# Patient Record
Sex: Male | Born: 1975 | Race: Black or African American | Hispanic: No | Marital: Single | Smoking: Former smoker
Health system: Southern US, Community
[De-identification: ages and names within clinical notes are randomized; demographics above are authoritative.]

## PROBLEM LIST (undated history)

## (undated) DIAGNOSIS — N200 Calculus of kidney: Secondary | ICD-10-CM

## (undated) HISTORY — PX: LITHOTRIPSY: SUR834

## (undated) HISTORY — PX: KIDNEY STONE SURGERY: SHX686

---

## 2002-07-15 ENCOUNTER — Emergency Department (HOSPITAL_COMMUNITY): Admission: EM | Admit: 2002-07-15 | Discharge: 2002-07-15 | Payer: Self-pay | Admitting: *Deleted

## 2003-10-13 ENCOUNTER — Emergency Department (HOSPITAL_COMMUNITY): Admission: EM | Admit: 2003-10-13 | Discharge: 2003-10-13 | Payer: Self-pay | Admitting: Emergency Medicine

## 2007-02-07 ENCOUNTER — Emergency Department (HOSPITAL_COMMUNITY): Admission: EM | Admit: 2007-02-07 | Discharge: 2007-02-07 | Payer: Self-pay | Admitting: Emergency Medicine

## 2008-12-02 ENCOUNTER — Emergency Department (HOSPITAL_COMMUNITY): Admission: EM | Admit: 2008-12-02 | Discharge: 2008-12-02 | Payer: Self-pay | Admitting: Emergency Medicine

## 2008-12-08 ENCOUNTER — Emergency Department (HOSPITAL_COMMUNITY): Admission: EM | Admit: 2008-12-08 | Discharge: 2008-12-08 | Payer: Self-pay | Admitting: Emergency Medicine

## 2009-04-12 ENCOUNTER — Emergency Department (HOSPITAL_COMMUNITY): Admission: EM | Admit: 2009-04-12 | Discharge: 2009-04-12 | Payer: Self-pay | Admitting: Emergency Medicine

## 2009-04-24 ENCOUNTER — Emergency Department (HOSPITAL_COMMUNITY): Admission: EM | Admit: 2009-04-24 | Discharge: 2009-04-24 | Payer: Self-pay | Admitting: Emergency Medicine

## 2009-12-01 ENCOUNTER — Emergency Department (HOSPITAL_COMMUNITY): Admission: EM | Admit: 2009-12-01 | Discharge: 2009-12-01 | Payer: Self-pay | Admitting: Family Medicine

## 2009-12-03 ENCOUNTER — Emergency Department (HOSPITAL_COMMUNITY): Admission: EM | Admit: 2009-12-03 | Discharge: 2009-12-03 | Payer: Self-pay | Admitting: Emergency Medicine

## 2010-02-02 ENCOUNTER — Emergency Department (HOSPITAL_COMMUNITY): Admission: EM | Admit: 2010-02-02 | Discharge: 2010-02-02 | Payer: Self-pay | Admitting: Emergency Medicine

## 2010-02-05 ENCOUNTER — Observation Stay (HOSPITAL_COMMUNITY): Admission: EM | Admit: 2010-02-05 | Discharge: 2010-02-05 | Payer: Self-pay | Admitting: Emergency Medicine

## 2010-02-08 ENCOUNTER — Emergency Department (HOSPITAL_COMMUNITY): Admission: EM | Admit: 2010-02-08 | Discharge: 2010-02-08 | Payer: Self-pay | Admitting: Emergency Medicine

## 2011-01-18 ENCOUNTER — Emergency Department (HOSPITAL_COMMUNITY)
Admission: EM | Admit: 2011-01-18 | Discharge: 2011-01-18 | Disposition: A | Discharge status: Home / Self Care | Payer: Self-pay | Attending: Emergency Medicine | Admitting: Emergency Medicine

## 2011-01-18 DIAGNOSIS — N342 Other urethritis: Secondary | ICD-10-CM | POA: Insufficient documentation

## 2011-01-18 DIAGNOSIS — R369 Urethral discharge, unspecified: Secondary | ICD-10-CM | POA: Insufficient documentation

## 2011-01-18 LAB — DIFFERENTIAL
Basophils Absolute: 0 K/uL (ref 0.0–0.1)
Basophils Relative: 1 % (ref 0–1)
Eosinophils Absolute: 0.1 K/uL (ref 0.0–0.7)
Eosinophils Relative: 2 % (ref 0–5)
Lymphocytes Relative: 42 % (ref 12–46)
Lymphs Abs: 2.3 10*3/uL (ref 0.7–4.0)
Monocytes Absolute: 0.5 K/uL (ref 0.1–1.0)
Monocytes Relative: 10 % (ref 3–12)
Neutro Abs: 2.5 10*3/uL (ref 1.7–7.7)
Neutrophils Relative %: 46 % (ref 43–77)

## 2011-01-18 LAB — BASIC METABOLIC PANEL WITH GFR
BUN: 11 mg/dL (ref 6–23)
Calcium: 8.9 mg/dL (ref 8.4–10.5)
GFR calc non Af Amer: >60 mL/min (ref >60)
Glucose, Bld: 94 mg/dL (ref 70–99)
Potassium: 4.1 meq/L (ref 3.5–5.1)
Sodium: 140 meq/L (ref 135–145)

## 2011-01-18 LAB — BASIC METABOLIC PANEL
CO2: 25 mEq/L (ref 19–32)
Chloride: 106 mEq/L (ref 96–112)
Creatinine, Ser: 1.13 mg/dL (ref 0.4–1.5)
GFR calc Af Amer: >60 mL/min (ref >60)

## 2011-01-18 LAB — URINALYSIS, ROUTINE W REFLEX MICROSCOPIC
Bilirubin Urine: NEGATIVE
Glucose, UA: NEGATIVE mg/dL
Ketones, ur: NEGATIVE mg/dL
Nitrite: NEGATIVE
Protein, ur: NEGATIVE mg/dL
Specific Gravity, Urine: 1.017 (ref 1.005–1.030)
Urobilinogen, UA: 0.2 mg/dL (ref 0.0–1.0)
pH: 6 (ref 5.0–8.0)

## 2011-01-18 LAB — CBC
HCT: 37.1 % — ABNORMAL LOW (ref 39.0–52.0)
Hemoglobin: 13.3 g/dL (ref 13.0–17.0)
MCH: 28.8 pg (ref 26.0–34.0)
MCHC: 35.8 g/dL (ref 30.0–36.0)
MCV: 80.3 fL (ref 78.0–100.0)
Platelets: 263 K/uL (ref 150–400)
RBC: 4.62 MIL/uL (ref 4.22–5.81)
RDW: 13.6 % (ref 11.5–15.5)
WBC: 5.5 K/uL (ref 4.0–10.5)

## 2011-01-18 LAB — URINE MICROSCOPIC-ADD ON

## 2011-02-06 LAB — CBC
HCT: 38.8 % — ABNORMAL LOW (ref 39.0–52.0)
Hemoglobin: 12.4 g/dL — ABNORMAL LOW (ref 13.0–17.0)
MCV: 87.3 fL (ref 78.0–100.0)
Platelets: 239 10*3/uL (ref 150–400)
RDW: 13.8 % (ref 11.5–15.5)

## 2011-02-06 LAB — BASIC METABOLIC PANEL
BUN: 12 mg/dL (ref 6–23)
CO2: 30 mEq/L (ref 19–32)
Chloride: 107 mEq/L (ref 96–112)
Glucose, Bld: 103 mg/dL — ABNORMAL HIGH (ref 70–99)
Potassium: 3.6 mEq/L (ref 3.5–5.1)

## 2011-02-06 LAB — DIFFERENTIAL
Basophils Absolute: 0.1 10*3/uL (ref 0.0–0.1)
Eosinophils Absolute: 0.1 10*3/uL (ref 0.0–0.7)
Eosinophils Relative: 1 % (ref 0–5)
Monocytes Absolute: 0.9 10*3/uL (ref 0.1–1.0)

## 2011-02-06 LAB — CULTURE, BLOOD (ROUTINE X 2): Culture: NO GROWTH

## 2011-02-28 LAB — URINALYSIS, ROUTINE W REFLEX MICROSCOPIC
Bilirubin Urine: NEGATIVE
Hgb urine dipstick: NEGATIVE
Ketones, ur: NEGATIVE mg/dL
Nitrite: NEGATIVE
Nitrite: NEGATIVE
Protein, ur: NEGATIVE mg/dL
Specific Gravity, Urine: 1.017 (ref 1.005–1.030)
Urobilinogen, UA: 0.2 mg/dL (ref 0.0–1.0)
Urobilinogen, UA: 1 mg/dL (ref 0.0–1.0)
pH: 6.5 (ref 5.0–8.0)

## 2011-02-28 LAB — URINE MICROSCOPIC-ADD ON

## 2011-02-28 LAB — URINE CULTURE

## 2011-02-28 LAB — GC/CHLAMYDIA PROBE AMP, GENITAL
Chlamydia, DNA Probe: NEGATIVE
GC Probe Amp, Genital: NEGATIVE

## 2011-02-28 LAB — RPR: RPR Ser Ql: NONREACTIVE

## 2011-07-15 ENCOUNTER — Encounter: Payer: Self-pay | Admitting: *Deleted

## 2011-07-15 ENCOUNTER — Emergency Department (HOSPITAL_BASED_OUTPATIENT_CLINIC_OR_DEPARTMENT_OTHER)
Admission: EM | Admit: 2011-07-15 | Discharge: 2011-07-15 | Disposition: A | Discharge status: Home / Self Care | Payer: Self-pay | Attending: Emergency Medicine | Admitting: Emergency Medicine

## 2011-07-15 DIAGNOSIS — Z202 Contact with and (suspected) exposure to infections with a predominantly sexual mode of transmission: Secondary | ICD-10-CM | POA: Insufficient documentation

## 2011-07-15 HISTORY — DX: Calculus of kidney: N20.0

## 2011-07-15 LAB — URINALYSIS, ROUTINE W REFLEX MICROSCOPIC
Bilirubin Urine: NEGATIVE
Hgb urine dipstick: NEGATIVE
Ketones, ur: NEGATIVE mg/dL
Nitrite: NEGATIVE
Urobilinogen, UA: 0.2 mg/dL (ref 0.0–1.0)
pH: 7.5 (ref 5.0–8.0)

## 2011-07-15 LAB — URINE MICROSCOPIC-ADD ON

## 2011-07-15 MED ORDER — METRONIDAZOLE 500 MG PO TABS
2000.0000 mg | ORAL_TABLET | Freq: Once | ORAL | Status: AC
  Administered 2011-07-15: 2000 mg via ORAL
  Filled 2011-07-15: qty 4

## 2011-07-15 MED ORDER — ONDANSETRON 8 MG PO TBDP
8.0000 mg | ORAL_TABLET | Freq: Once | ORAL | Status: AC
  Administered 2011-07-15: 8 mg via ORAL
  Filled 2011-07-15: qty 1

## 2011-07-15 MED ORDER — LIDOCAINE HCL (PF) 1 % IJ SOLN
INTRAMUSCULAR | Status: AC
  Administered 2011-07-15: 1 mL via INTRADERMAL
  Filled 2011-07-15: qty 5

## 2011-07-15 MED ORDER — CEFTRIAXONE SODIUM 250 MG IJ SOLR
250.0000 mg | Freq: Once | INTRAMUSCULAR | Status: AC
  Administered 2011-07-15: 250 mg via INTRAMUSCULAR
  Filled 2011-07-15: qty 250

## 2011-07-15 MED ORDER — LIDOCAINE HCL (PF) 1 % IJ SOLN
1.0000 mL | Freq: Once | INTRAMUSCULAR | Status: AC
  Administered 2011-07-15: 1 mL via INTRADERMAL

## 2011-07-15 MED ORDER — AZITHROMYCIN 250 MG PO TABS
1000.0000 mg | ORAL_TABLET | Freq: Once | ORAL | Status: AC
  Administered 2011-07-15: 1000 mg via ORAL
  Filled 2011-07-15: qty 4

## 2011-07-15 NOTE — ED Provider Notes (Signed)
History     CSN: [id number] Arrival date & time: 07/15/2011 10:32 AM  Chief Complaint  Patient presents with  . Male GU Problem   Patient is a 35 y.o. male presenting with male genitourinary complaint. The history is provided by the patient.  Male GU Problem Primary symptoms include no dysuria. Primary symptoms comment: exposure to STD This is a new problem. The current episode started more than 2 days ago. The problem has not changed since onset.Pertinent negatives include no nausea, no vomiting, no abdominal pain and no frequency. There has been no fever. He has tried nothing for the symptoms. Sexual activity: sexually active (last partner was recently diagnosed with either trichomonsas or chlamydia. ). Partner displays symptoms of an STD: yes.    Past Medical History  Diagnosis Date  . Kidney stone     Past Surgical History  Procedure Date  . Kidney stone surgery     No family history on file.  History  Substance Use Topics  . Smoking status: Never Smoker   . Smokeless tobacco: Not on file  . Alcohol Use: Yes      Review of Systems  Constitutional: Negative for fever.  Gastrointestinal: Negative for nausea, vomiting and abdominal pain.  Genitourinary: Negative for dysuria, urgency, frequency, discharge, penile swelling and testicular pain.  Musculoskeletal: Negative for back pain.    Physical Exam  BP 128/69  Pulse 56  Temp(Src) 97.9 F (36.6 C) (Oral)  Resp 18  Ht 5\' 9"  (1.753 m)  Wt 170 lb (77.111 kg)  BMI 25.10 kg/m2  SpO2 100%  Physical Exam  Constitutional: He is oriented to person, place, and time. He appears well-developed and well-nourished.  HENT:  Head: Normocephalic and atraumatic.  Eyes: Pupils are equal, round, and reactive to light.  Neck: Normal range of motion. Neck supple.  Cardiovascular: Normal rate, regular rhythm and normal heart sounds.   Pulmonary/Chest: Effort normal and breath sounds normal. No respiratory distress. He has no  wheezes. He has no rales. He exhibits no tenderness.  Abdominal: Soft. Bowel sounds are normal. There is no tenderness. There is no rebound and no guarding.  Genitourinary: Penis normal. No penile tenderness.  Musculoskeletal: Normal range of motion. He exhibits no edema.  Lymphadenopathy:    He has no cervical adenopathy.  Neurological: He is alert and oriented to person, place, and time.  Skin: Skin is warm and dry. No rash noted.  Psychiatric: He has a normal mood and affect.    ED Course  Procedures  MDM Gc/chlamydia ordered.  Will go ahead and tx pt  Results for orders placed during the hospital encounter of 07/15/11  URINALYSIS, ROUTINE W REFLEX MICROSCOPIC      Component Value Range   Color, Urine YELLOW  YELLOW    Appearance CLEAR  CLEAR    Specific Gravity, Urine 1.014  1.005 - 1.030    pH 7.5  5.0 - 8.0    Glucose, UA NEGATIVE  NEGATIVE (mg/dL)   Hgb urine dipstick NEGATIVE  NEGATIVE    Bilirubin Urine NEGATIVE  NEGATIVE    Ketones, ur NEGATIVE  NEGATIVE (mg/dL)   Protein, ur NEGATIVE  NEGATIVE (mg/dL)   Urobilinogen, UA 0.2  0.0 - 1.0 (mg/dL)   Nitrite NEGATIVE  NEGATIVE    Leukocytes, UA TRACE (*) NEGATIVE   URINE MICROSCOPIC-ADD ON      Component Value Range   Squamous Epithelial / LPF RARE  RARE    WBC, UA 3-6  <3 (WBC/hpf)  RBC / HPF 0-2  <3 (RBC/hpf)   Bacteria, UA FEW (*) RARE    No results found.        Rolan Bucco, MD 07/15/11 1239

## 2011-07-15 NOTE — ED Notes (Addendum)
Pt sts his partner was recently dx with trichomonas. Pt denies discharge or difficulty urinating.

## 2011-07-16 LAB — GC/CHLAMYDIA PROBE AMP, GENITAL
Chlamydia, DNA Probe: NEGATIVE
GC Probe Amp, Genital: NEGATIVE

## 2011-09-28 ENCOUNTER — Encounter (HOSPITAL_BASED_OUTPATIENT_CLINIC_OR_DEPARTMENT_OTHER): Payer: Self-pay

## 2011-09-28 ENCOUNTER — Emergency Department (INDEPENDENT_AMBULATORY_CARE_PROVIDER_SITE_OTHER): Payer: Self-pay

## 2011-09-28 ENCOUNTER — Emergency Department (HOSPITAL_BASED_OUTPATIENT_CLINIC_OR_DEPARTMENT_OTHER)
Admission: EM | Admit: 2011-09-28 | Discharge: 2011-09-28 | Disposition: A | Discharge status: Home / Self Care | Payer: Self-pay | Attending: Emergency Medicine | Admitting: Emergency Medicine

## 2011-09-28 DIAGNOSIS — IMO0001 Reserved for inherently not codable concepts without codable children: Secondary | ICD-10-CM | POA: Insufficient documentation

## 2011-09-28 DIAGNOSIS — R509 Fever, unspecified: Secondary | ICD-10-CM

## 2011-09-28 DIAGNOSIS — R079 Chest pain, unspecified: Secondary | ICD-10-CM

## 2011-09-28 DIAGNOSIS — J069 Acute upper respiratory infection, unspecified: Secondary | ICD-10-CM | POA: Insufficient documentation

## 2011-09-28 DIAGNOSIS — R05 Cough: Secondary | ICD-10-CM | POA: Insufficient documentation

## 2011-09-28 DIAGNOSIS — R0602 Shortness of breath: Secondary | ICD-10-CM

## 2011-09-28 DIAGNOSIS — R059 Cough, unspecified: Secondary | ICD-10-CM | POA: Insufficient documentation

## 2011-09-28 MED ORDER — ONDANSETRON HCL 8 MG PO TABS
8.0000 mg | ORAL_TABLET | Freq: Once | ORAL | Status: AC
  Administered 2011-09-28: 8 mg via ORAL

## 2011-09-28 MED ORDER — ONDANSETRON HCL 8 MG PO TABS
ORAL_TABLET | ORAL | Status: AC
  Filled 2011-09-28: qty 1

## 2011-09-28 MED ORDER — IBUPROFEN 800 MG PO TABS: 800.0000 mg | ORAL_TABLET | Freq: Three times a day (TID) | ORAL | Status: AC

## 2011-09-28 MED ORDER — ACETAMINOPHEN 325 MG PO TABS
650.0000 mg | ORAL_TABLET | Freq: Once | ORAL | Status: AC
  Administered 2011-09-28: 650 mg via ORAL
  Filled 2011-09-28: qty 2

## 2011-09-28 MED ORDER — IBUPROFEN 800 MG PO TABS
800.0000 mg | ORAL_TABLET | Freq: Once | ORAL | Status: AC
  Administered 2011-09-28: 800 mg via ORAL
  Filled 2011-09-28: qty 1

## 2011-09-28 MED ORDER — ACETAMINOPHEN-CODEINE 120-12 MG/5ML PO SUSP: 5.0000 mL | Freq: Four times a day (QID) | ORAL | Status: AC | PRN

## 2011-09-28 NOTE — ED Provider Notes (Signed)
History     CSN: [id number] Arrival date & time: 09/28/2011  8:23 PM   First MD Initiated Contact with Patient 09/28/11 2137      Chief Complaint  Patient presents with  . URI    (Consider location/radiation/quality/duration/timing/severity/associated sxs/prior treatment) Patient is a 35 y.o. male presenting with URI. The history is provided by the patient.  URI The primary symptoms include fever, sore throat, cough and myalgias. Primary symptoms do not include headaches, wheezing, abdominal pain, vomiting or rash. The current episode started yesterday. This is a new problem. The problem has not changed since onset. The onset of the illness is associated with exposure to sick contacts. Symptoms associated with the illness include chills and rhinorrhea. The illness is not associated with sinus pressure. Risk factors: No diabetes or medications. He is a previous smoker.   Moderate in severity. No aggravating or alleviating factors. Taking over-the-counter medications with minimal relief. Patient became worried when he started sweating and felt febrile tonight. Wife and child at home had similar symptoms.  Past Medical History  Diagnosis Date  . Kidney stone     Past Surgical History  Procedure Date  . Kidney stone surgery     No family history on file.  History  Substance Use Topics  . Smoking status: Never Smoker   . Smokeless tobacco: Not on file  . Alcohol Use: No      Review of Systems  Constitutional: Positive for fever and chills.  HENT: Positive for sore throat and rhinorrhea. Negative for neck pain, neck stiffness and sinus pressure.   Eyes: Negative for pain.  Respiratory: Positive for cough. Negative for shortness of breath and wheezing.   Cardiovascular: Negative for chest pain.  Gastrointestinal: Negative for vomiting and abdominal pain.  Genitourinary: Negative for dysuria.  Musculoskeletal: Positive for myalgias. Negative for back pain.  Skin: Negative  for rash.  Neurological: Negative for headaches.  All other systems reviewed and are negative.    Allergies  Review of patient's allergies indicates no known allergies.  Home Medications   Current Outpatient Rx  Name Route Sig Dispense Refill  . PSEUDOEPHEDRINE-APAP-DM 95-621-30 MG/30ML PO LIQD Oral Take 30 mLs by mouth once.        BP 127/70  Pulse 80  Temp(Src) 99.8 F (37.7 C) (Oral)  Resp 20  Ht 5\' 9"  (1.753 m)  Wt 165 lb (74.844 kg)  BMI 24.37 kg/m2  SpO2 99%  Physical Exam  Constitutional: He is oriented to person, place, and time. He appears well-developed and well-nourished.  HENT:  Head: Normocephalic and atraumatic.  Eyes: Conjunctivae and EOM are normal. Pupils are equal, round, and reactive to light.  Neck: Trachea normal. Neck supple. No thyromegaly present.  Cardiovascular: Normal rate, regular rhythm, S1 normal, S2 normal and normal pulses.     No systolic murmur is present   No diastolic murmur is present  Pulses:      Radial pulses are 2+ on the right side, and 2+ on the left side.  Pulmonary/Chest: Effort normal and breath sounds normal. He has no wheezes. He has no rhonchi. He has no rales. He exhibits no tenderness.  Abdominal: Soft. Normal appearance and bowel sounds are normal. There is no tenderness. There is no CVA tenderness and negative Murphy's sign.  Musculoskeletal:       BLE:s Calves nontender, no cords or erythema, negative Homans sign  Neurological: He is alert and oriented to person, place, and time. He has normal strength. No cranial  nerve deficit or sensory deficit. GCS eye subscore is 4. GCS verbal subscore is 5. GCS motor subscore is 6.  Skin: Skin is warm and dry. No rash noted. He is not diaphoretic.  Psychiatric: His speech is normal.       Cooperative and appropriate    ED Course  Procedures (including critical care time)  Labs Reviewed - No data to display Dg Chest 2 View  09/28/2011  *RADIOLOGY REPORT*  Clinical Data:  35 year old male with cough and fever.  Shortness of breath and chest pain.  CHEST - 2 VIEW  Comparison: 04/12/2009.  Findings: Lung volumes remain normal. Normal cardiac size and mediastinal contours.  Visualized tracheal air column is within normal limits.  The lungs remain clear.  No pneumothorax or effusion. No osseous abnormality identified.  IMPRESSION: Negative, no acute cardiopulmonary abnormality.  Original Report Authenticated By: Harley Hallmark, M.D.    Tylenol, Motrin, by mouth fluids and x-rays above.   MDM   Clinical presentation consistent with URI. Likely viral with sick contacts at home. Tylenol and Motrin provided emergency department and chest x-ray obtained and reviewed as above. No pneumonia. Stable for discharge home with viral URI precautions.        Sunnie Nielsen, MD 09/28/11 [phone]

## 2011-09-28 NOTE — ED Notes (Signed)
C/o sweats, chills, cough

## 2012-02-29 ENCOUNTER — Encounter (HOSPITAL_BASED_OUTPATIENT_CLINIC_OR_DEPARTMENT_OTHER): Payer: Self-pay | Admitting: *Deleted

## 2012-02-29 DIAGNOSIS — R109 Unspecified abdominal pain: Secondary | ICD-10-CM | POA: Insufficient documentation

## 2012-02-29 LAB — URINALYSIS, ROUTINE W REFLEX MICROSCOPIC
Ketones, ur: NEGATIVE mg/dL
Nitrite: NEGATIVE
Urobilinogen, UA: 0.2 mg/dL (ref 0.0–1.0)
pH: 6 (ref 5.0–8.0)

## 2012-02-29 LAB — URINE MICROSCOPIC-ADD ON

## 2012-02-29 NOTE — ED Notes (Signed)
Pt c/o left flank pain x 2 days.

## 2012-02-29 NOTE — ED Notes (Signed)
Hx kidney stones

## 2012-03-01 ENCOUNTER — Emergency Department (HOSPITAL_BASED_OUTPATIENT_CLINIC_OR_DEPARTMENT_OTHER)
Admission: EM | Admit: 2012-03-01 | Discharge: 2012-03-01 | Disposition: A | Discharge status: Home / Self Care | Payer: Self-pay | Attending: Emergency Medicine | Admitting: Emergency Medicine

## 2012-03-01 HISTORY — DX: Calculus of kidney: N20.0

## 2012-03-01 NOTE — ED Notes (Signed)
Pt not found in waiting room.

## 2012-03-01 NOTE — ED Notes (Signed)
Pt called in lobby and parking lot no 'answeer

## 2012-07-06 ENCOUNTER — Emergency Department (HOSPITAL_BASED_OUTPATIENT_CLINIC_OR_DEPARTMENT_OTHER)
Admission: EM | Admit: 2012-07-06 | Discharge: 2012-07-06 | Disposition: A | Discharge status: Home / Self Care | Payer: Self-pay | Attending: Emergency Medicine | Admitting: Emergency Medicine

## 2012-07-06 ENCOUNTER — Encounter (HOSPITAL_BASED_OUTPATIENT_CLINIC_OR_DEPARTMENT_OTHER): Payer: Self-pay | Admitting: Emergency Medicine

## 2012-07-06 DIAGNOSIS — A5900 Urogenital trichomoniasis, unspecified: Secondary | ICD-10-CM | POA: Insufficient documentation

## 2012-07-06 DIAGNOSIS — Z202 Contact with and (suspected) exposure to infections with a predominantly sexual mode of transmission: Secondary | ICD-10-CM | POA: Insufficient documentation

## 2012-07-06 DIAGNOSIS — Z87891 Personal history of nicotine dependence: Secondary | ICD-10-CM | POA: Insufficient documentation

## 2012-07-06 DIAGNOSIS — Z87442 Personal history of urinary calculi: Secondary | ICD-10-CM | POA: Insufficient documentation

## 2012-07-06 LAB — URINE MICROSCOPIC-ADD ON

## 2012-07-06 LAB — URINALYSIS, ROUTINE W REFLEX MICROSCOPIC
Bilirubin Urine: NEGATIVE
Nitrite: NEGATIVE
Specific Gravity, Urine: 1.015 (ref 1.005–1.030)
Urobilinogen, UA: 1 mg/dL (ref 0.0–1.0)
pH: 7 (ref 5.0–8.0)

## 2012-07-06 MED ORDER — AZITHROMYCIN 250 MG PO TABS
1000.0000 mg | ORAL_TABLET | Freq: Once | ORAL | Status: AC
  Administered 2012-07-06: 1000 mg via ORAL
  Filled 2012-07-06: qty 4

## 2012-07-06 MED ORDER — METRONIDAZOLE 500 MG PO TABS
2000.0000 mg | ORAL_TABLET | Freq: Once | ORAL | Status: AC
  Administered 2012-07-06: 2000 mg via ORAL
  Filled 2012-07-06: qty 4

## 2012-07-06 MED ORDER — LIDOCAINE HCL (PF) 1 % IJ SOLN
INTRAMUSCULAR | Status: AC
  Administered 2012-07-06: 5 mL
  Filled 2012-07-06: qty 5

## 2012-07-06 MED ORDER — CEFTRIAXONE SODIUM 250 MG IJ SOLR
250.0000 mg | Freq: Once | INTRAMUSCULAR | Status: AC
  Administered 2012-07-06: 250 mg via INTRAMUSCULAR
  Filled 2012-07-06: qty 250

## 2012-07-06 NOTE — ED Provider Notes (Signed)
History     CSN: [id number]  Arrival date & time 07/06/12  1049   First MD Initiated Contact with Patient 07/06/12 1057      Chief Complaint  Patient presents with  . SEXUALLY TRANSMITTED DISEASE    (Consider location/radiation/quality/duration/timing/severity/associated sxs/prior treatment) HPI Pt reports his sexual partner was recently diagnosed with trichomonas. He has had unprotected sex with her. He denies any symptoms himself. No penile discharge, pain with urination, testicular pain or swelling.   Past Medical History  Diagnosis Date  . Kidney stone   . Kidney calculi     Past Surgical History  Procedure Date  . Kidney stone surgery   . Lithotripsy     No family history on file.  History  Substance Use Topics  . Smoking status: Former Games developer  . Smokeless tobacco: Not on file  . Alcohol Use: Yes     occ      Review of Systems All other systems reviewed and are negative except as noted in HPI.    Allergies  Review of patient's allergies indicates no known allergies.  Home Medications   Current Outpatient Rx  Name Route Sig Dispense Refill  . PSEUDOEPHEDRINE-APAP-DM 56-213-08 MG/30ML PO LIQD Oral Take 30 mLs by mouth once.        BP 131/62  Pulse 63  Temp 98.1 F (36.7 C) (Oral)  Resp 20  Ht 5\' 9"  (1.753 m)  Wt 155 lb (70.308 kg)  BMI 22.89 kg/m2  SpO2 100%  Physical Exam  Constitutional: He is oriented to person, place, and time. He appears well-developed and well-nourished.  HENT:  Head: Normocephalic and atraumatic.  Neck: Neck supple.  Pulmonary/Chest: Effort normal.  Genitourinary: Penis normal.       No discharge or sores, no lymphadenopathy, no testicular pain or swelling  Neurological: He is alert and oriented to person, place, and time. No cranial nerve deficit.  Psychiatric: He has a normal mood and affect. His behavior is normal.    ED Course  Procedures (including critical care time)   Labs Reviewed  GC/CHLAMYDIA PROBE  AMP, URINE  URINALYSIS, ROUTINE W REFLEX MICROSCOPIC   No results found.   No diagnosis found.    MDM  UA and GC/C sent. Will treat empirically for trich as well as GC/C       Charles B. Bernette Mayers, MD 07/06/12 1120

## 2012-07-06 NOTE — ED Notes (Signed)
Pt reports his partner being dx with STD.

## 2012-07-07 LAB — GC/CHLAMYDIA PROBE AMP, URINE
Chlamydia, Swab/Urine, PCR: NEGATIVE
GC Probe Amp, Urine: NEGATIVE

## 2012-12-26 ENCOUNTER — Encounter (HOSPITAL_BASED_OUTPATIENT_CLINIC_OR_DEPARTMENT_OTHER): Payer: Self-pay

## 2012-12-26 ENCOUNTER — Emergency Department (HOSPITAL_BASED_OUTPATIENT_CLINIC_OR_DEPARTMENT_OTHER)
Admission: EM | Admit: 2012-12-26 | Discharge: 2012-12-26 | Disposition: A | Discharge status: Home / Self Care | Payer: Self-pay | Attending: Emergency Medicine | Admitting: Emergency Medicine

## 2012-12-26 DIAGNOSIS — L0231 Cutaneous abscess of buttock: Secondary | ICD-10-CM | POA: Insufficient documentation

## 2012-12-26 DIAGNOSIS — L03317 Cellulitis of buttock: Secondary | ICD-10-CM | POA: Insufficient documentation

## 2012-12-26 DIAGNOSIS — Z87442 Personal history of urinary calculi: Secondary | ICD-10-CM | POA: Insufficient documentation

## 2012-12-26 DIAGNOSIS — F17200 Nicotine dependence, unspecified, uncomplicated: Secondary | ICD-10-CM | POA: Insufficient documentation

## 2012-12-26 MED ORDER — SULFAMETHOXAZOLE-TRIMETHOPRIM 800-160 MG PO TABS: 1.0000 | ORAL_TABLET | Freq: Two times a day (BID) | ORAL | Status: DC

## 2012-12-26 MED ORDER — HYDROCODONE-ACETAMINOPHEN 5-325 MG PO TABS: 2.0000 | ORAL_TABLET | Freq: Four times a day (QID) | ORAL | Status: DC | PRN

## 2012-12-26 NOTE — ED Notes (Signed)
Abscess to right buttock x 1-2 weeks

## 2012-12-26 NOTE — ED Provider Notes (Signed)
History     CSN: [id number]  Arrival date & time 12/26/12  1156   First MD Initiated Contact with Patient 12/26/12 1212      Chief Complaint  Patient presents with  . Abscess    (Consider location/radiation/quality/duration/timing/severity/associated sxs/prior treatment) Patient is a 37 y.o. male presenting with abscess. The history is provided by the patient.  Abscess Abscess location: buttock. Abscess quality: fluctuance, induration, painful, redness and warmth   Red streaking: no   Duration:  7 days Progression:  Worsening Pain details:    Quality:  Throbbing   Severity:  Moderate   Timing:  Constant   Progression:  Worsening Chronicity:  New Relieved by:  Nothing Worsened by:  Nothing tried Ineffective treatments:  None tried   Past Medical History  Diagnosis Date  . Kidney stone   . Kidney calculi     Past Surgical History  Procedure Laterality Date  . Kidney stone surgery    . Lithotripsy      No family history on file.  History  Substance Use Topics  . Smoking status: Current Every Day Smoker  . Smokeless tobacco: Not on file  . Alcohol Use: Yes     Comment: occ      Review of Systems  All other systems reviewed and are negative.    Allergies  Review of patient's allergies indicates no known allergies.  Home Medications   Current Outpatient Rx  Name  Route  Sig  Dispense  Refill  . Pseudoephedrine-APAP-DM (DAYQUIL MULTI-SYMPTOM) 60-650-20 MG/30ML LIQD   Oral   Take 30 mLs by mouth once.             BP 147/72  Pulse 61  Temp(Src) 97.8 F (36.6 C) (Oral)  Resp 16  Ht 5\' 9"  (1.753 m)  Wt 170 lb (77.111 kg)  BMI 25.09 kg/m2  SpO2 100%  Physical Exam  Nursing note and vitals reviewed. Constitutional: He is oriented to person, place, and time. He appears well-developed and well-nourished. No distress.  HENT:  Head: Normocephalic and atraumatic.  Mouth/Throat: Oropharynx is clear and moist.  Neck: Normal range of motion.  Neck supple.  Neurological: He is alert and oriented to person, place, and time.  Skin: Skin is warm and dry. He is not diaphoretic.  There is a swollen, ttp, fluctuant area to the right buttock.  There is mild erythema.    ED Course  Procedures (including critical care time)  Labs Reviewed - No data to display No results found.   No diagnosis found.  INCISION AND DRAINAGE Performed by: Geoffery Lyons Consent: Verbal consent obtained. Risks and benefits: risks, benefits and alternatives were discussed Type: abscess  Body area: right buttock  Anesthesia: local infiltration  Incision was made with a scalpel.  Local anesthetic: lidocaine 1% without epinephrine  Anesthetic total: 2 ml  Complexity: complex Blunt dissection to break up loculations  Drainage: purulent  Drainage amount: mild  Packing material: none  Patient tolerance: Patient tolerated the procedure well with no immediate complications.     MDM  The abscess was I and Ded and will be treated with warm soaks, local wound care.  I will prescribe bactrim and pain meds.  Return prn.          Geoffery Lyons, MD 12/26/12 1229

## 2013-02-14 ENCOUNTER — Encounter (HOSPITAL_BASED_OUTPATIENT_CLINIC_OR_DEPARTMENT_OTHER): Payer: Self-pay

## 2013-02-14 ENCOUNTER — Emergency Department (HOSPITAL_BASED_OUTPATIENT_CLINIC_OR_DEPARTMENT_OTHER)
Admission: EM | Admit: 2013-02-14 | Discharge: 2013-02-14 | Disposition: A | Discharge status: Home / Self Care | Payer: Self-pay | Attending: Emergency Medicine | Admitting: Emergency Medicine

## 2013-02-14 DIAGNOSIS — Z87891 Personal history of nicotine dependence: Secondary | ICD-10-CM | POA: Insufficient documentation

## 2013-02-14 DIAGNOSIS — L539 Erythematous condition, unspecified: Secondary | ICD-10-CM | POA: Insufficient documentation

## 2013-02-14 DIAGNOSIS — L02415 Cutaneous abscess of right lower limb: Secondary | ICD-10-CM

## 2013-02-14 DIAGNOSIS — Z87442 Personal history of urinary calculi: Secondary | ICD-10-CM | POA: Insufficient documentation

## 2013-02-14 DIAGNOSIS — L02419 Cutaneous abscess of limb, unspecified: Secondary | ICD-10-CM | POA: Insufficient documentation

## 2013-02-14 MED ORDER — HYDROCODONE-ACETAMINOPHEN 5-325 MG PO TABS
ORAL_TABLET | ORAL | Status: AC
  Filled 2013-02-14: qty 1

## 2013-02-14 MED ORDER — CEPHALEXIN 500 MG PO CAPS: 500.0000 mg | ORAL_CAPSULE | Freq: Four times a day (QID) | ORAL | Status: DC

## 2013-02-14 MED ORDER — HYDROCODONE-ACETAMINOPHEN 5-325 MG PO TABS
2.0000 | ORAL_TABLET | Freq: Once | ORAL | Status: AC
  Administered 2013-02-14: 2 via ORAL
  Filled 2013-02-14: qty 1

## 2013-02-14 MED ORDER — HYDROCODONE-ACETAMINOPHEN 5-325 MG PO TABS: 2.0000 | ORAL_TABLET | ORAL | Status: DC | PRN

## 2013-02-14 MED ORDER — SULFAMETHOXAZOLE-TRIMETHOPRIM 800-160 MG PO TABS: 1.0000 | ORAL_TABLET | Freq: Two times a day (BID) | ORAL | Status: DC

## 2013-02-14 MED ORDER — LIDOCAINE HCL 2 % IJ SOLN
INTRAMUSCULAR | Status: AC
  Administered 2013-02-14: 400 mg
  Filled 2013-02-14: qty 20

## 2013-02-14 MED ORDER — CEFTRIAXONE SODIUM 1 G IJ SOLR
1.0000 g | Freq: Once | INTRAMUSCULAR | Status: AC
  Administered 2013-02-14: 1 g via INTRAMUSCULAR
  Filled 2013-02-14: qty 10

## 2013-02-14 NOTE — ED Notes (Signed)
I&D riht knee performed by Keenan Bachelor, PA-C- pt tolerated well.

## 2013-02-14 NOTE — ED Notes (Signed)
Redness/pain to right knee x 2-3 days-denies injury

## 2013-02-16 ENCOUNTER — Emergency Department (HOSPITAL_BASED_OUTPATIENT_CLINIC_OR_DEPARTMENT_OTHER)
Admission: EM | Admit: 2013-02-16 | Discharge: 2013-02-16 | Disposition: A | Discharge status: Home / Self Care | Payer: Self-pay | Attending: Emergency Medicine | Admitting: Emergency Medicine

## 2013-02-16 ENCOUNTER — Encounter (HOSPITAL_BASED_OUTPATIENT_CLINIC_OR_DEPARTMENT_OTHER): Payer: Self-pay

## 2013-02-16 DIAGNOSIS — Z87442 Personal history of urinary calculi: Secondary | ICD-10-CM | POA: Insufficient documentation

## 2013-02-16 DIAGNOSIS — L03119 Cellulitis of unspecified part of limb: Secondary | ICD-10-CM | POA: Insufficient documentation

## 2013-02-16 DIAGNOSIS — Z87891 Personal history of nicotine dependence: Secondary | ICD-10-CM | POA: Insufficient documentation

## 2013-02-16 DIAGNOSIS — L0291 Cutaneous abscess, unspecified: Secondary | ICD-10-CM

## 2013-02-16 DIAGNOSIS — L02419 Cutaneous abscess of limb, unspecified: Secondary | ICD-10-CM | POA: Insufficient documentation

## 2013-02-16 NOTE — ED Notes (Signed)
Pt states that he was seen here last week for R knee inflammation and swelling.  Pt states that he presents today for MD to reevaluate the swelling and see if it has improved or not.

## 2013-02-16 NOTE — ED Provider Notes (Signed)
History     CSN: [id number]  Arrival date & time 02/16/13  1204   First MD Initiated Contact with Patient 02/16/13 1214      Chief Complaint  Patient presents with  . Follow-up    (Consider location/radiation/quality/duration/timing/severity/associated sxs/prior treatment) HPI Comments: Patient is here for recheck of an abscess on his right knee. He was seen here 2 days ago and had an ID of an abscess to his right knee. He was started on Bactrim and Keflex. He states that he's been taking the antibiotics and doing warm soaks. He states that his knee is feeling much better. He states that the swelling and redness have markedly decreased and that the pain is better. He does say that still draining. He denies any fevers or chills.   Past Medical History  Diagnosis Date  . Kidney stone   . Kidney calculi     Past Surgical History  Procedure Laterality Date  . Kidney stone surgery    . Lithotripsy      History reviewed. No pertinent family history.  History  Substance Use Topics  . Smoking status: Former Games developer  . Smokeless tobacco: Not on file  . Alcohol Use: No      Review of Systems  Constitutional: Negative for fever.  Gastrointestinal: Negative for nausea and vomiting.  Musculoskeletal: Positive for arthralgias.  Skin: Positive for wound.  Neurological: Negative for headaches.    Allergies  Review of patient's allergies indicates no known allergies.  Home Medications   Current Outpatient Rx  Name  Route  Sig  Dispense  Refill  . cephALEXin (KEFLEX) 500 MG capsule   Oral   Take 1 capsule (500 mg total) by mouth 4 (four) times daily.   28 capsule   0   . HYDROcodone-acetaminophen (NORCO/VICODIN) 5-325 MG per tablet   Oral   Take 2 tablets by mouth every 6 (six) hours as needed for pain.   15 tablet   0   . Pseudoephedrine-APAP-DM (DAYQUIL MULTI-SYMPTOM) 60-650-20 MG/30ML LIQD   Oral   Take 30 mLs by mouth once.           .  sulfamethoxazole-trimethoprim (BACTRIM DS,SEPTRA DS) 800-160 MG per tablet   Oral   Take 1 tablet by mouth 2 (two) times daily.   14 tablet   0   . HYDROcodone-acetaminophen (NORCO/VICODIN) 5-325 MG per tablet   Oral   Take 2 tablets by mouth every 4 (four) hours as needed for pain.   10 tablet   0   . sulfamethoxazole-trimethoprim (SEPTRA DS) 800-160 MG per tablet   Oral   Take 1 tablet by mouth every 12 (twelve) hours.   14 tablet   0     BP 123/71  Pulse 69  Temp(Src) 98.6 F (37 C) (Oral)  Resp 16  Ht 5\' 9"  (1.753 m)  Wt 160 lb (72.576 kg)  BMI 23.62 kg/m2  SpO2 100%  Physical Exam  Constitutional: He appears well-developed and well-nourished.  Cardiovascular: Normal rate.   Pulmonary/Chest: Effort normal.  Musculoskeletal:  Patient has a small draining wound to his right suprapatellar area. It is draining but there is no surrounding induration or fluctuance. It is still swollen in the area and there's some mild surrounding erythema. There is no joint effusion or tenderness on range of motion of the joint.    ED Course  Procedures (including critical care time)  Labs Reviewed - No data to display No results found.   1. Abscess  MDM  Patient with improving infection of his right knee. There is no evidence of joint involvement. He states it's markedly improved since 2 days ago. I advised him to continue antibiotics through a full course and to return here as needed if his symptoms worsen.        Rolan Bucco, MD 02/16/13 [phone]

## 2013-02-17 NOTE — ED Provider Notes (Signed)
History     CSN: [id number]  Arrival date & time 02/14/13  1747   First MD Initiated Contact with Patient 02/14/13 1816      Chief Complaint  Patient presents with  . Knee Pain    (Consider location/radiation/quality/duration/timing/severity/associated sxs/prior treatment) Patient is a 37 y.o. male presenting with rash. The history is provided by the patient. No language interpreter was used.  Rash Location:  Leg Leg rash location:  R knee Quality: painful and redness   Pain details:    Quality:  Burning   Severity:  Moderate   Duration:  3 days   Timing:  Constant   Progression:  Worsening Severity:  Moderate Duration:  3 days Progression:  Improving Relieved by:  Nothing Worsened by:  Nothing tried Associated symptoms: no fever and no nausea    Pt complains of redness and swelling to his right knee Past Medical History  Diagnosis Date  . Kidney stone   . Kidney calculi     Past Surgical History  Procedure Laterality Date  . Kidney stone surgery    . Lithotripsy      No family history on file.  History  Substance Use Topics  . Smoking status: Former Games developer  . Smokeless tobacco: Not on file  . Alcohol Use: No      Review of Systems  Constitutional: Negative for fever.  Gastrointestinal: Negative for nausea.  Musculoskeletal: Positive for joint swelling.  Skin: Positive for rash.  All other systems reviewed and are negative.    Allergies  Review of patient's allergies indicates no known allergies.  Home Medications   Current Outpatient Rx  Name  Route  Sig  Dispense  Refill  . cephALEXin (KEFLEX) 500 MG capsule   Oral   Take 1 capsule (500 mg total) by mouth 4 (four) times daily.   28 capsule   0   . HYDROcodone-acetaminophen (NORCO/VICODIN) 5-325 MG per tablet   Oral   Take 2 tablets by mouth every 6 (six) hours as needed for pain.   15 tablet   0   . HYDROcodone-acetaminophen (NORCO/VICODIN) 5-325 MG per tablet   Oral   Take 2  tablets by mouth every 4 (four) hours as needed for pain.   10 tablet   0   . Pseudoephedrine-APAP-DM (DAYQUIL MULTI-SYMPTOM) 60-650-20 MG/30ML LIQD   Oral   Take 30 mLs by mouth once.           . sulfamethoxazole-trimethoprim (BACTRIM DS,SEPTRA DS) 800-160 MG per tablet   Oral   Take 1 tablet by mouth 2 (two) times daily.   14 tablet   0   . sulfamethoxazole-trimethoprim (SEPTRA DS) 800-160 MG per tablet   Oral   Take 1 tablet by mouth every 12 (twelve) hours.   14 tablet   0     BP 123/64  Pulse 67  Temp(Src) 98.6 F (37 C) (Oral)  Resp 16  Ht 5\' 9"  (1.753 m)  Wt 150 lb (68.04 kg)  BMI 22.14 kg/m2  SpO2 100%  Physical Exam  Nursing note and vitals reviewed. Constitutional: He is oriented to person, place, and time. He appears well-developed and well-nourished.  HENT:  Head: Normocephalic and atraumatic.  Cardiovascular: Normal rate.   Pulmonary/Chest: Effort normal.  Neurological: He is alert and oriented to person, place, and time. He has normal reflexes.  Skin: There is erythema.  12 cm area of erythema warm to touch  Psychiatric: He has a normal mood and affect.  ED Course  INCISION AND DRAINAGE Date/Time: 02/18/2013 11:15 PM Performed by: Elson Areas Authorized by: Elson Areas Consent: Verbal consent obtained. Risks and benefits: risks, benefits and alternatives were discussed Consent given by: patient Patient understanding: patient states understanding of the procedure being performed Patient identity confirmed: verbally with patient Time out: Immediately prior to procedure a "time out" was called to verify the correct patient, procedure, equipment, support staff and site/side marked as required. Type: abscess Body area: lower extremity Location details: right leg Local anesthetic: lidocaine 1% without epinephrine Scalpel size: 11 Incision type: single straight Complexity: simple Drainage amount: moderate Wound treatment: wound left  open Patient tolerance: Patient tolerated the procedure well with no immediate complications.   (including critical care time)  Labs Reviewed - No data to display No results found.   1. Abscess of knee, right       MDM  rx for bactrim and keflex,   Pt advised to return in 2 days for wound recheck        Elson Areas, PA-C 02/18/13 2316

## 2013-02-21 NOTE — ED Provider Notes (Signed)
Medical screening examination/treatment/procedure(s) were performed by non-physician practitioner and as supervising physician I was immediately available for consultation/collaboration.   Karely Hurtado, MD 02/21/13 0704 

## 2013-04-02 ENCOUNTER — Encounter (HOSPITAL_BASED_OUTPATIENT_CLINIC_OR_DEPARTMENT_OTHER): Payer: Self-pay | Admitting: *Deleted

## 2013-04-02 ENCOUNTER — Emergency Department (HOSPITAL_BASED_OUTPATIENT_CLINIC_OR_DEPARTMENT_OTHER)
Admission: EM | Admit: 2013-04-02 | Discharge: 2013-04-02 | Disposition: A | Discharge status: Home / Self Care | Payer: Self-pay | Attending: Emergency Medicine | Admitting: Emergency Medicine

## 2013-04-02 DIAGNOSIS — Z87442 Personal history of urinary calculi: Secondary | ICD-10-CM | POA: Insufficient documentation

## 2013-04-02 DIAGNOSIS — Z79899 Other long term (current) drug therapy: Secondary | ICD-10-CM | POA: Insufficient documentation

## 2013-04-02 DIAGNOSIS — Z87891 Personal history of nicotine dependence: Secondary | ICD-10-CM | POA: Insufficient documentation

## 2013-04-02 DIAGNOSIS — M7989 Other specified soft tissue disorders: Secondary | ICD-10-CM | POA: Insufficient documentation

## 2013-04-02 DIAGNOSIS — L0390 Cellulitis, unspecified: Secondary | ICD-10-CM

## 2013-04-02 DIAGNOSIS — R21 Rash and other nonspecific skin eruption: Secondary | ICD-10-CM | POA: Insufficient documentation

## 2013-04-02 DIAGNOSIS — L02419 Cutaneous abscess of limb, unspecified: Secondary | ICD-10-CM | POA: Insufficient documentation

## 2013-04-02 MED ORDER — SULFAMETHOXAZOLE-TRIMETHOPRIM 800-160 MG PO TABS: 1.0000 | ORAL_TABLET | Freq: Two times a day (BID) | ORAL | Status: DC

## 2013-04-02 MED ORDER — CEPHALEXIN 500 MG PO CAPS: 500.0000 mg | ORAL_CAPSULE | Freq: Four times a day (QID) | ORAL | Status: DC

## 2013-04-02 NOTE — ED Provider Notes (Signed)
History     CSN: [id number]  Arrival date & time 04/02/13  1037   First MD Initiated Contact with Patient 04/02/13 1159      Chief Complaint  Patient presents with  . Recurrent Skin Infections    (Consider location/radiation/quality/duration/timing/severity/associated sxs/prior treatment) HPI Comments: Patient presents with redness and swelling to left lateral elbow for the past 3 days. Denies any trauma. Denies any fevers, bleeding or drainage. Treated for cellulitis of his knee 6 weeks ago which she says is resolved. Denies any chest pain, shortness of breath, chills.  The history is provided by the patient.    Past Medical History  Diagnosis Date  . Kidney stone   . Kidney calculi     Past Surgical History  Procedure Laterality Date  . Kidney stone surgery    . Lithotripsy      History reviewed. No pertinent family history.  History  Substance Use Topics  . Smoking status: Former Games developer  . Smokeless tobacco: Not on file  . Alcohol Use: No      Review of Systems  Constitutional: Negative for fever and activity change.  HENT: Negative for congestion and rhinorrhea.   Respiratory: Negative for cough, chest tightness and shortness of breath.   Cardiovascular: Negative for chest pain.  Gastrointestinal: Negative for nausea, vomiting and abdominal pain.  Genitourinary: Negative for dysuria and hematuria.  Musculoskeletal: Positive for arthralgias. Negative for back pain.  Skin: Positive for rash.  Neurological: Negative for dizziness and headaches.  A complete 10 system review of systems was obtained and all systems are negative except as noted in the HPI and PMH.    Allergies  Review of patient's allergies indicates no known allergies.  Home Medications   Current Outpatient Rx  Name  Route  Sig  Dispense  Refill  . cephALEXin (KEFLEX) 500 MG capsule   Oral   Take 1 capsule (500 mg total) by mouth 4 (four) times daily.   28 capsule   0   .  HYDROcodone-acetaminophen (NORCO/VICODIN) 5-325 MG per tablet   Oral   Take 2 tablets by mouth every 6 (six) hours as needed for pain.   15 tablet   0   . HYDROcodone-acetaminophen (NORCO/VICODIN) 5-325 MG per tablet   Oral   Take 2 tablets by mouth every 4 (four) hours as needed for pain.   10 tablet   0   . Pseudoephedrine-APAP-DM (DAYQUIL MULTI-SYMPTOM) 60-650-20 MG/30ML LIQD   Oral   Take 30 mLs by mouth once.           . sulfamethoxazole-trimethoprim (BACTRIM DS,SEPTRA DS) 800-160 MG per tablet   Oral   Take 1 tablet by mouth 2 (two) times daily.   14 tablet   0   . sulfamethoxazole-trimethoprim (SEPTRA DS) 800-160 MG per tablet   Oral   Take 1 tablet by mouth every 12 (twelve) hours.   14 tablet   0     BP 128/58  Pulse 60  Temp(Src) 98.3 F (36.8 C) (Oral)  Resp 16  Ht 5\' 9"  (1.753 m)  Wt 170 lb (77.111 kg)  BMI 25.09 kg/m2  SpO2 100%  Physical Exam  Constitutional: He is oriented to person, place, and time. He appears well-developed and well-nourished. No distress.  HENT:  Head: Normocephalic and atraumatic.  Mouth/Throat: Oropharynx is clear and moist. No oropharyngeal exudate.  Eyes: Conjunctivae and EOM are normal. Pupils are equal, round, and reactive to light.  Neck: Normal range of motion. Neck supple.  Cardiovascular: Normal rate, regular rhythm and normal heart sounds.   No murmur heard. Pulmonary/Chest: Effort normal and breath sounds normal. No respiratory distress.  Abdominal: Soft. There is no tenderness. There is no rebound and no guarding.  Musculoskeletal: Normal range of motion. He exhibits edema and tenderness.  there 2 cm area of induration and erythema to left lateral elbow. No fluctuance. full range of motion of the elbow joint. +2 radial pulse  Neurological: He is alert and oriented to person, place, and time. No cranial nerve deficit. He exhibits normal muscle tone. Coordination normal.  Skin: Skin is warm.    ED Course   Procedures (including critical care time)  Labs Reviewed - No data to display No results found.   No diagnosis found.    MDM  Elbow cellulitis without evidence of septic joint. Vital stable, afebrile.  Bedside ultrasound shows no fluid collection. We'll treat with Keflex and Bactrim. Recheck with PCP in 2 days.  EMERGENCY DEPARTMENT US SOFT TISSUE INTERPRETATION "Study: Limited Ultrasound of the noted body part in comments below"  INDICATIONS: Soft tissue infection Multiple views of the body part are obtained with a multi-frequency linear probe  PERFORMED BY:  Myself  IMAGES ARCHIVED?: No  SIDE:Left  BODY PART:Upper extremity  FINDINGS: No abcess noted and Cellulitis present  LIMITATIONS:    INTERPRETATION:  No abcess noted and Cellulitis present  COMMENT:         Glynn Octave, MD 04/02/13 1319

## 2013-04-02 NOTE — ED Notes (Signed)
Red swollen area near left elbow noticed the area 3 days ago no drainage

## 2013-04-28 ENCOUNTER — Encounter (HOSPITAL_BASED_OUTPATIENT_CLINIC_OR_DEPARTMENT_OTHER): Payer: Self-pay

## 2013-04-28 ENCOUNTER — Emergency Department (HOSPITAL_BASED_OUTPATIENT_CLINIC_OR_DEPARTMENT_OTHER)
Admission: EM | Admit: 2013-04-28 | Discharge: 2013-04-28 | Disposition: A | Discharge status: Home / Self Care | Payer: Self-pay | Attending: Emergency Medicine | Admitting: Emergency Medicine

## 2013-04-28 ENCOUNTER — Emergency Department (HOSPITAL_BASED_OUTPATIENT_CLINIC_OR_DEPARTMENT_OTHER): Payer: Self-pay

## 2013-04-28 DIAGNOSIS — M79671 Pain in right foot: Secondary | ICD-10-CM

## 2013-04-28 DIAGNOSIS — Z87442 Personal history of urinary calculi: Secondary | ICD-10-CM | POA: Insufficient documentation

## 2013-04-28 DIAGNOSIS — M79609 Pain in unspecified limb: Secondary | ICD-10-CM | POA: Insufficient documentation

## 2013-04-28 DIAGNOSIS — Z87891 Personal history of nicotine dependence: Secondary | ICD-10-CM | POA: Insufficient documentation

## 2013-04-28 MED ORDER — IBUPROFEN 800 MG PO TABS
800.0000 mg | ORAL_TABLET | Freq: Once | ORAL | Status: AC
  Administered 2013-04-28: 800 mg via ORAL
  Filled 2013-04-28: qty 1

## 2013-04-28 MED ORDER — IBUPROFEN 800 MG PO TABS: 800.0000 mg | ORAL_TABLET | Freq: Three times a day (TID) | ORAL | Status: DC | PRN

## 2013-04-28 NOTE — ED Notes (Signed)
Patient here with right foot pain x 1 day. Swelling to lateral aspect, unsure if trauma. Pain with ambulation

## 2013-04-28 NOTE — ED Provider Notes (Signed)
History  This chart was scribed for Vincent Riley B. Bernette Mayers, MD by Vincent Riley, ED scribe. This patient was seen in room MH04/MH04 and the patient's care was started at 2042.   CSN: [id number]  Arrival date & time 04/28/13  2042   First MD Initiated Contact with Patient 04/28/13 2233      Chief Complaint  Patient presents with  . Foot Pain   Patient is a 37 y.o. male presenting with lower extremity pain. The history is provided by the patient. No language interpreter was used.  Foot Pain This is a new problem. The current episode started 6 to 12 hours ago. The problem occurs constantly. The problem has not changed since onset.Pertinent negatives include no chest pain and no shortness of breath. The symptoms are aggravated by walking. Nothing relieves the symptoms. Treatments tried: ibuprofen. The treatment provided no relief.   Vincent Riley is a 37 y.o. male who presents to the Emergency Department complaining of constant gradually worsening right foot pain over the past day. He denies any recent injuries or trauma to his foot. He states difficult with ambulation and localizes pain to the lateral region of his right foot. He denies any other pain or symptoms. He tried taking ibuprofen at home without any relief from pain. He does not have a PCP.   Past Medical History  Diagnosis Date  . Kidney stone   . Kidney calculi     Past Surgical History  Procedure Laterality Date  . Kidney stone surgery    . Lithotripsy      No family history on file.  History  Substance Use Topics  . Smoking status: Former Games developer  . Smokeless tobacco: Not on file  . Alcohol Use: No      Review of Systems  Constitutional: Negative for fever and chills.  Respiratory: Negative for shortness of breath.   Cardiovascular: Negative for chest pain.  Gastrointestinal: Negative for nausea and vomiting.  Musculoskeletal:       Right lateral foot pain  Neurological: Negative for weakness.  All other systems  reviewed and are negative.   A complete 10 system review of systems was obtained and all systems are negative except as noted in the HPI and PMH.   Allergies  Review of patient's allergies indicates no known allergies.  Home Medications   Current Outpatient Rx  Name  Route  Sig  Dispense  Refill  . sulfamethoxazole-trimethoprim (SEPTRA DS) 800-160 MG per tablet   Oral   Take 1 tablet by mouth every 12 (twelve) hours.   14 tablet   0     Triage Vitals: BP 136/77  Pulse 96  Temp(Src) 99.6 F (37.6 C) (Oral)  Resp 18  SpO2 100%  Physical Exam  Nursing note and vitals reviewed. Constitutional: He is oriented to person, place, and time. He appears well-developed and well-nourished.  HENT:  Head: Normocephalic and atraumatic.  Neck: Neck supple.  Pulmonary/Chest: Effort normal.  Musculoskeletal: Normal range of motion. He exhibits tenderness (R lateral foot, no deformity, swelling, erythema or warmth).  Neurological: He is alert and oriented to person, place, and time. No cranial nerve deficit.  Psychiatric: He has a normal mood and affect. His behavior is normal.    ED Course  Procedures (including critical care time) DIAGNOSTIC STUDIES: Oxygen Saturation is 100% on room air, normal by my interpretation.    COORDINATION OF CARE: At 1035 PM Discussed treatment plan with patient which includes ibuprofen, right foot X-ray. Patient agrees.  Labs Reviewed - No data to display Dg Foot Complete Right  04/28/2013   *RADIOLOGY REPORT*  Clinical Data: Right foot pain.  RIGHT FOOT COMPLETE - 3+ VIEW  Comparison: No priors.  Findings: Three views of the right foot demonstrate no acute displaced fracture, subluxation, dislocation, joint or soft tissue abnormality.  IMPRESSION: No acute radiographic abnormality of the right foot.   Original Report Authenticated By: Trudie Reed, M.D.     1. Foot pain, right       MDM  I personally performed the services described in this  documentation, which was scribed in my presence. The recorded information has been reviewed and is accurate.   Benign exam, normal xray. Post-op shoe, crutches, NSAIDs and PCP followup.          Vincent Riley B. Bernette Mayers, MD 04/28/13 2310

## 2013-04-28 NOTE — ED Notes (Signed)
Patient states that he "doesn't need the crutches"  Post-Op shoe fitted.

## 2013-07-16 ENCOUNTER — Emergency Department (HOSPITAL_BASED_OUTPATIENT_CLINIC_OR_DEPARTMENT_OTHER)
Admission: EM | Admit: 2013-07-16 | Discharge: 2013-07-16 | Disposition: A | Discharge status: Home / Self Care | Payer: Self-pay | Attending: Emergency Medicine | Admitting: Emergency Medicine

## 2013-07-16 ENCOUNTER — Encounter (HOSPITAL_BASED_OUTPATIENT_CLINIC_OR_DEPARTMENT_OTHER): Payer: Self-pay | Admitting: Emergency Medicine

## 2013-07-16 DIAGNOSIS — L0390 Cellulitis, unspecified: Secondary | ICD-10-CM

## 2013-07-16 DIAGNOSIS — L02219 Cutaneous abscess of trunk, unspecified: Secondary | ICD-10-CM | POA: Insufficient documentation

## 2013-07-16 DIAGNOSIS — Z87442 Personal history of urinary calculi: Secondary | ICD-10-CM | POA: Insufficient documentation

## 2013-07-16 DIAGNOSIS — Z87891 Personal history of nicotine dependence: Secondary | ICD-10-CM | POA: Insufficient documentation

## 2013-07-16 MED ORDER — CLINDAMYCIN HCL 150 MG PO CAPS
300.0000 mg | ORAL_CAPSULE | Freq: Once | ORAL | Status: AC
  Administered 2013-07-16: 300 mg via ORAL
  Filled 2013-07-16: qty 2

## 2013-07-16 MED ORDER — CLINDAMYCIN HCL 300 MG PO CAPS: 300.0000 mg | ORAL_CAPSULE | Freq: Four times a day (QID) | ORAL | Status: DC

## 2013-07-16 NOTE — ED Provider Notes (Signed)
CSN: [id number]     Arrival date & time 07/16/13  2228 History   First MD Initiated Contact with Patient 07/16/13 2250     Chief Complaint  Patient presents with  . Leg Pain   (Consider location/radiation/quality/duration/timing/severity/associated sxs/prior Treatment) The history is provided by the patient.  Vincent Riley is a 37 y.o. male history kidney stone, skin infections here presenting with left groin swelling. Noticed swelling in the left groin area the last 2 days. Denies any bug bites but he said the pain progressively more painful. Denies any fevers or chills. He had a skin infections in the past and was on Keflex and Bactrim. He remembers ED had MRSA in the past. Denies any involvement into his scrotum or penile discharge.    Past Medical History  Diagnosis Date  . Kidney stone   . Kidney calculi    Past Surgical History  Procedure Laterality Date  . Kidney stone surgery    . Lithotripsy     History reviewed. No pertinent family history. History  Substance Use Topics  . Smoking status: Former Games developer  . Smokeless tobacco: Not on file  . Alcohol Use: No    Review of Systems  Skin: Positive for rash.  All other systems reviewed and are negative.    Allergies  Review of patient's allergies indicates no known allergies.  Home Medications   Current Outpatient Rx  Name  Route  Sig  Dispense  Refill  . ibuprofen (ADVIL,MOTRIN) 800 MG tablet   Oral   Take 1 tablet (800 mg total) by mouth every 8 (eight) hours as needed for pain.   30 tablet   0   . sulfamethoxazole-trimethoprim (SEPTRA DS) 800-160 MG per tablet   Oral   Take 1 tablet by mouth every 12 (twelve) hours.   14 tablet   0    BP 130/69  Pulse 60  Temp(Src) 98 F (36.7 C) (Oral)  Resp 20  Ht 5\' 9"  (1.753 m)  Wt 170 lb (77.111 kg)  BMI 25.09 kg/m2  SpO2 100% Physical Exam  Nursing note and vitals reviewed. Constitutional: He is oriented to person, place, and time. He appears  well-developed and well-nourished.  Comfortable   HENT:  Head: Normocephalic.  Eyes: Pupils are equal, round, and reactive to light.  Neck: Normal range of motion.  Cardiovascular: Normal rate.   Pulmonary/Chest: Effort normal and breath sounds normal.  Abdominal: Soft.  Musculoskeletal: Normal range of motion.  Neurological: He is alert and oriented to person, place, and time.  Skin: Skin is warm and dry.     Small pimple on L groin area with surrounding erythema. No fluctuance. No tender lymph nodes. No involvement with the scrotum.   Psychiatric: He has a normal mood and affect. His behavior is normal. Judgment and thought content normal.    ED Course  Procedures (including critical care time) Labs Review Labs Reviewed - No data to display Imaging Review No results found.  MDM  No diagnosis found. Vincent Riley is a 37 y.o. male here with mild cellulitis. Will give clinda given previous skin infections to cover MRSA. Return precautions given to patient.      Richardean Canal, MD 07/16/13 2257

## 2013-07-16 NOTE — ED Notes (Signed)
Pt states that he developed a knot to his left medial thigh distal to groin, pt reports that it showed up yesterday and gotten progressively painful

## 2013-11-06 ENCOUNTER — Encounter (HOSPITAL_BASED_OUTPATIENT_CLINIC_OR_DEPARTMENT_OTHER): Payer: Self-pay | Admitting: Emergency Medicine

## 2013-11-06 ENCOUNTER — Emergency Department (HOSPITAL_BASED_OUTPATIENT_CLINIC_OR_DEPARTMENT_OTHER)
Admission: EM | Admit: 2013-11-06 | Discharge: 2013-11-06 | Disposition: A | Discharge status: Home / Self Care | Payer: Self-pay | Attending: Emergency Medicine | Admitting: Emergency Medicine

## 2013-11-06 DIAGNOSIS — D72829 Elevated white blood cell count, unspecified: Secondary | ICD-10-CM | POA: Insufficient documentation

## 2013-11-06 DIAGNOSIS — A64 Unspecified sexually transmitted disease: Secondary | ICD-10-CM | POA: Insufficient documentation

## 2013-11-06 DIAGNOSIS — Z87442 Personal history of urinary calculi: Secondary | ICD-10-CM | POA: Insufficient documentation

## 2013-11-06 DIAGNOSIS — Z87891 Personal history of nicotine dependence: Secondary | ICD-10-CM | POA: Insufficient documentation

## 2013-11-06 DIAGNOSIS — R51 Headache: Secondary | ICD-10-CM | POA: Insufficient documentation

## 2013-11-06 LAB — URINE MICROSCOPIC-ADD ON

## 2013-11-06 LAB — RPR: RPR Ser Ql: NONREACTIVE

## 2013-11-06 LAB — URINALYSIS, ROUTINE W REFLEX MICROSCOPIC
Glucose, UA: NEGATIVE mg/dL
Ketones, ur: NEGATIVE mg/dL
Protein, ur: NEGATIVE mg/dL
Urobilinogen, UA: 0.2 mg/dL (ref 0.0–1.0)

## 2013-11-06 MED ORDER — AZITHROMYCIN 250 MG PO TABS
1000.0000 mg | ORAL_TABLET | Freq: Once | ORAL | Status: AC
  Administered 2013-11-06: 1000 mg via ORAL
  Filled 2013-11-06: qty 4

## 2013-11-06 MED ORDER — CEFTRIAXONE SODIUM 250 MG IJ SOLR
250.0000 mg | Freq: Once | INTRAMUSCULAR | Status: AC
  Administered 2013-11-06: 250 mg via INTRAMUSCULAR
  Filled 2013-11-06: qty 250

## 2013-11-06 NOTE — ED Notes (Signed)
Left side rib pain x 3 days.  No known injury.  No N/V/D.  No dysuria.  No fever.

## 2013-11-07 LAB — GC/CHLAMYDIA PROBE AMP: CT Probe RNA: NEGATIVE

## 2013-11-07 NOTE — ED Provider Notes (Signed)
CSN: [id number]     Arrival date & time 11/06/13  1533 History   First MD Initiated Contact with Patient 11/06/13 1610     Chief Complaint  Patient presents with  . Abdominal Pain   (Consider location/radiation/quality/duration/timing/severity/associated sxs/prior Treatment) HPI  This is a 37 year old male who presents with a chief complaint of "my boyfriend was diagnosed with gonorrhea and I will be treated." Patient reported a different complaint in triage but states that he was embarrassed. Patient's girlfriend was diagnosed and treated for gonorrhea this past week.  Patient denies any dysuria or urethral discharge. He denies any other sexual partners. He denies any other complaints at this time.   Past Medical History  Diagnosis Date  . Kidney stone   . Kidney calculi    Past Surgical History  Procedure Laterality Date  . Kidney stone surgery    . Lithotripsy     No family history on file. History  Substance Use Topics  . Smoking status: Former Games developer  . Smokeless tobacco: Not on file  . Alcohol Use: No    Review of Systems  Constitutional: Negative.  Negative for fever.  Respiratory: Negative.  Negative for shortness of breath.   Cardiovascular: Negative.  Negative for chest pain.  Gastrointestinal: Negative.  Negative for abdominal pain.  Genitourinary: Negative.  Negative for dysuria, discharge and penile pain.  Skin: Negative for rash.  Neurological: Positive for headaches.  All other systems reviewed and are negative.    Allergies  Review of patient's allergies indicates no known allergies.  Home Medications  No current outpatient prescriptions on file. BP 130/74  Pulse 62  Temp(Src) 98.3 F (36.8 C) (Oral)  Resp 17  Ht 5\' 9"  (1.753 m)  Wt 165 lb (74.844 kg)  BMI 24.36 kg/m2  SpO2 100% Physical Exam  Nursing note and vitals reviewed. Constitutional: He is oriented to person, place, and time. He appears well-developed and well-nourished. No  distress.  HENT:  Head: Normocephalic and atraumatic.  Mouth/Throat: Oropharynx is clear and moist.  Eyes: Pupils are equal, round, and reactive to light.  Neck: Neck supple.  Cardiovascular: Normal rate, regular rhythm and normal heart sounds.   No murmur heard. Pulmonary/Chest: Effort normal. No respiratory distress.  Abdominal: Soft.  Genitourinary: Penis normal.  No discharge noted, circumcised  Musculoskeletal: He exhibits no edema.  Lymphadenopathy:    He has no cervical adenopathy.  Neurological: He is alert and oriented to person, place, and time.  Skin: Skin is warm and dry. No rash noted.  Psychiatric: He has a normal mood and affect.    ED Course  Procedures (including critical care time) Labs Review Labs Reviewed  URINALYSIS, ROUTINE W REFLEX MICROSCOPIC - Abnormal; Notable for the following:    Hgb urine dipstick TRACE (*)    Leukocytes, UA TRACE (*)    All other components within normal limits  GC/CHLAMYDIA PROBE AMP  URINE MICROSCOPIC-ADD ON  RPR   Imaging Review No results found.  EKG Interpretation   None       MDM   1. STD (male)    Patient presents with a chief complaint of concerns for STD. He is nontoxic-appearing on exam and is currently asymptomatic. He does have a partner who was recently treated for gonorrhea. Will treat the patient for gonorrhea and chlamydia at this time. I have sent a swab as well as an RPR.  Patient is otherwise well-appearing. Patient does have trace leuks on urinalysis which could be evidence of STD.  Patient was instructed to avoid sexual intercourse until testing is completed.  After history, exam, and medical workup I feel the patient has been appropriately medically screened and is safe for discharge home. Pertinent diagnoses were discussed with the patient. Patient was given return precautions.   Shon Baton, MD 11/07/13 1435

## 2014-01-08 ENCOUNTER — Emergency Department (HOSPITAL_BASED_OUTPATIENT_CLINIC_OR_DEPARTMENT_OTHER)
Admission: EM | Admit: 2014-01-08 | Discharge: 2014-01-08 | Disposition: A | Discharge status: Home / Self Care | Payer: Self-pay | Attending: Emergency Medicine | Admitting: Emergency Medicine

## 2014-01-08 ENCOUNTER — Encounter (HOSPITAL_BASED_OUTPATIENT_CLINIC_OR_DEPARTMENT_OTHER): Payer: Self-pay | Admitting: Emergency Medicine

## 2014-01-08 DIAGNOSIS — Z87442 Personal history of urinary calculi: Secondary | ICD-10-CM | POA: Insufficient documentation

## 2014-01-08 DIAGNOSIS — L089 Local infection of the skin and subcutaneous tissue, unspecified: Secondary | ICD-10-CM | POA: Insufficient documentation

## 2014-01-08 DIAGNOSIS — F17200 Nicotine dependence, unspecified, uncomplicated: Secondary | ICD-10-CM | POA: Insufficient documentation

## 2014-01-08 MED ORDER — CEPHALEXIN 500 MG PO CAPS: 500.0000 mg | ORAL_CAPSULE | Freq: Four times a day (QID) | ORAL | Status: DC

## 2014-01-08 NOTE — Discharge Instructions (Signed)
Wound Infection  A wound infection happens when a type of germ (bacteria) starts growing in the wound. In some cases, this can cause the wound to break open. If cared for properly, the infected wound will heal from the inside to the outside. Wound infections need treatment.  CAUSES  An infection is caused by bacteria growing in the wound.   SYMPTOMS    Increase in redness, swelling, or pain at the wound site.   Increase in drainage at the wound site.   Wound or bandage (dressing) starts to smell bad.   Fever.   Feeling tired or fatigued.   Pus draining from the wound.  TREATMENT   You caregiver will prescribe antibiotic medicine. The wound infection should improve within 24 to 48 hours. Any redness around the wound should stop spreading and the wound should be less painful.   HOME CARE INSTRUCTIONS    Only take over-the-counter or prescription medicines for pain, discomfort, or fever as directed by your caregiver.   Take your antibiotics as directed. Finish them even if you start to feel better.   Gently wash the area with mild soap and water 2 times a day, or as directed. Rinse off the soap. Pat the area dry with a clean towel. Do not rub the wound. This may cause bleeding.   Follow your caregiver's instructions for how often you need to change the dressing.   Apply ointment and a dressing to the wound as directed.   If the dressing sticks, moisten it with soapy water and gently remove it.   Change the bandage right away if it becomes wet, dirty, or develops a bad smell.   Take showers. Do not take tub baths, swim, or do anything that may soak the wound until it is healed.   Avoid exercises that make you sweat heavily.   Use anti-itch medicine as directed by your caregiver. The wound may itch when it is healing. Do not pick or scratch at the wound.   Follow up with your caregiver to get your wound rechecked as directed.  SEEK MEDICAL CARE IF:   You have an increase in swelling, pain, or redness  around the wound.   You have an increase in the amount of pus coming from the wound.   There is a bad smell coming from the wound.   More of the wound breaks open.   You have a fever.  MAKE SURE YOU:    Understand these instructions.   Will watch your condition.   Will get help right away if you are not doing well or get worse.  Document Released: 07/30/2003 Document Revised: 01/23/2012 Document Reviewed: 03/06/2011  [employer] Patient Information 2014 [employer]

## 2014-01-08 NOTE — ED Provider Notes (Signed)
CSN: [id number]     Arrival date & time 01/08/14  1957 History   First MD Initiated Contact with Patient 01/08/14 2013     Chief Complaint  Patient presents with  . Rash     (Consider location/radiation/quality/duration/timing/severity/associated sxs/prior Treatment) HPI Comments: Pt states that he developed to different areas on ear of his arms where is looks scabbed and red.denies drainage. Pt states that he has been using ringworm cream without relief. Pt states that he thinks that he got it from his dog. Pt states that it started as red raised bumps  The history is provided by the patient. No language interpreter was used.    Past Medical History  Diagnosis Date  . Kidney stone   . Kidney calculi    Past Surgical History  Procedure Laterality Date  . Kidney stone surgery    . Lithotripsy     No family history on file. History  Substance Use Topics  . Smoking status: Current Every Day Smoker  . Smokeless tobacco: Not on file  . Alcohol Use: No    Review of Systems  Constitutional: Negative.   Respiratory: Negative.   Cardiovascular: Negative.       Allergies  Review of patient's allergies indicates no known allergies.  Home Medications  No current outpatient prescriptions on file. BP 150/72  Pulse 66  Temp(Src) 98.9 F (37.2 C) (Oral)  Resp 16  Ht 5\' 9"  (1.753 m)  Wt 170 lb (77.111 kg)  BMI 25.09 kg/m2  SpO2 100% Physical Exam  Nursing note and vitals reviewed. Constitutional: He is oriented to person, place, and time. He appears well-developed and well-nourished.  Cardiovascular: Normal rate and regular rhythm.   Pulmonary/Chest: Effort normal and breath sounds normal.  Musculoskeletal: Normal range of motion.  Neurological: He is alert and oriented to person, place, and time.  Skin:  Pt has scabbed red areas to to left forearm and right upper NWG:[id number]:drainage noted  Psychiatric: He has a normal mood and affect.    ED Course  Procedures (including  critical care time) Labs Review Labs Reviewed - No data to display Imaging Review No results found.  EKG Interpretation   None       MDM   Final diagnoses:  Skin infection    Will treat with keflex    Teressa LowerVrinda Zaniel Marineau, NP 01/08/14 2029

## 2014-01-08 NOTE — ED Provider Notes (Signed)
Medical screening examination/treatment/procedure(s) were performed by non-physician practitioner and as supervising physician I was immediately available for consultation/collaboration.    Nelia Shiobert L Jaylee Freeze, MD 01/08/14 2031

## 2014-01-08 NOTE — ED Notes (Signed)
rash to both arms x 1 week

## 2014-05-02 ENCOUNTER — Encounter (HOSPITAL_COMMUNITY): Payer: Self-pay | Admitting: Emergency Medicine

## 2014-05-02 ENCOUNTER — Emergency Department (HOSPITAL_COMMUNITY)
Admission: EM | Admit: 2014-05-02 | Discharge: 2014-05-02 | Disposition: A | Discharge status: Home / Self Care | Payer: Self-pay | Attending: Emergency Medicine | Admitting: Emergency Medicine

## 2014-05-02 DIAGNOSIS — Z79899 Other long term (current) drug therapy: Secondary | ICD-10-CM | POA: Insufficient documentation

## 2014-05-02 DIAGNOSIS — S40261A Insect bite (nonvenomous) of right shoulder, initial encounter: Secondary | ICD-10-CM

## 2014-05-02 DIAGNOSIS — Z87442 Personal history of urinary calculi: Secondary | ICD-10-CM | POA: Insufficient documentation

## 2014-05-02 DIAGNOSIS — Y92009 Unspecified place in unspecified non-institutional (private) residence as the place of occurrence of the external cause: Secondary | ICD-10-CM | POA: Insufficient documentation

## 2014-05-02 DIAGNOSIS — F17200 Nicotine dependence, unspecified, uncomplicated: Secondary | ICD-10-CM | POA: Insufficient documentation

## 2014-05-02 DIAGNOSIS — Y9389 Activity, other specified: Secondary | ICD-10-CM | POA: Insufficient documentation

## 2014-05-02 DIAGNOSIS — W57XXXA Bitten or stung by nonvenomous insect and other nonvenomous arthropods, initial encounter: Principal | ICD-10-CM | POA: Insufficient documentation

## 2014-05-02 DIAGNOSIS — S40269A Insect bite (nonvenomous) of unspecified shoulder, initial encounter: Secondary | ICD-10-CM | POA: Insufficient documentation

## 2014-05-02 MED ORDER — CEPHALEXIN 500 MG PO CAPS: 500.0000 mg | ORAL_CAPSULE | Freq: Four times a day (QID) | ORAL | Status: DC

## 2014-05-02 NOTE — ED Provider Notes (Signed)
Medical screening examination/treatment/procedure(s) were performed by non-physician practitioner and as supervising physician I was immediately available for consultation/collaboration.   EKG Interpretation None        Kristen N Ward, DO 05/02/14 1220 

## 2014-05-02 NOTE — Discharge Instructions (Signed)
Most insect bites does not cause infection.  However after 2-3 days and you notice worsening pain, redness, swelling then take antibiotic as prescribed. Not that doesn't help after 48hrs of taking antibiotic, follow up at urgent care for further care.  You may apply neosporin or hydrocortisone 1% cream to wound twice daily.  Insect Bite Mosquitoes, flies, fleas, bedbugs, and many other insects can bite. Insect bites are different from insect stings. A sting is when venom is injected into the skin. Some insect bites can transmit infectious diseases. SYMPTOMS  Insect bites usually turn red, swell, and itch for 2 to 4 days. They often go away on their own. TREATMENT  Your caregiver may prescribe antibiotic medicines if a bacterial infection develops in the bite. HOME CARE INSTRUCTIONS  Do not scratch the bite area.  Keep the bite area clean and dry. Wash the bite area thoroughly with soap and water.  Put ice or cool compresses on the bite area.  Put ice in a plastic bag.  Place a towel between your skin and the bag.  Leave the ice on for 20 minutes, 4 times a day for the first 2 to 3 days, or as directed.  You may apply a baking soda paste, cortisone cream, or calamine lotion to the bite area as directed by your caregiver. This can help reduce itching and swelling.  Only take over-the-counter or prescription medicines as directed by your caregiver.  If you are given antibiotics, take them as directed. Finish them even if you start to feel better. You may need a tetanus shot if:  You cannot remember when you had your last tetanus shot.  You have never had a tetanus shot.  The injury broke your skin. If you get a tetanus shot, your arm may swell, get red, and feel warm to the touch. This is common and not a problem. If you need a tetanus shot and you choose not to have one, there is a rare chance of getting tetanus. Sickness from tetanus can be serious. SEEK IMMEDIATE MEDICAL CARE IF:     You have increased pain, redness, or swelling in the bite area.  You see a red line on the skin coming from the bite.  You have a fever.  You have joint pain.  You have a headache or neck pain.  You have unusual weakness.  You have a rash.  You have chest pain or shortness of breath.  You have abdominal pain, nausea, or vomiting.  You feel unusually tired or sleepy. MAKE SURE YOU:   Understand these instructions.  Will watch your condition.  Will get help right away if you are not doing well or get worse. Document Released: 12/08/2004 Document Revised: 01/23/2012 Document Reviewed: 06/01/2011 [facility] Patient Information 2015 [place], [place]. This information is not intended to replace advice given to you by your health care provider. Make sure you discuss any questions you have with your health care provider.

## 2014-05-02 NOTE — Progress Notes (Signed)
[facility] provided pt with a list of primary care resources to help patient establish primary care.  °

## 2014-05-02 NOTE — ED Provider Notes (Signed)
CSN: [id number]     Arrival date & time 05/02/14  1146 History   This chart was scribed for Fayrene HelperBowie Tran, PA-C, working with Layla MawKristen N Ward, DO, by [facility] Scribe. This patient was seen in room WTR8/WTR8 and the patient's care was started at 12:05 PM.   Chief Complaint  Patient presents with  . bump on shoulder    The history is provided by the patient. No language interpreter was used.    HPI Comments: Vincent Riley is a 38 y.o. male who presents to the Emergency Department complaining of an area of gradually worsening swelling to his right shoulder that began yesterday. He states that he was working in his yard with his shirt off yesterday, and he believes he was bitten by an insect. He reports associated mild pain to the area of swelling. He states that he has applied Poison Ivy cream to the area without relief. He denies difficulty swallowing or breathing. He states that he has no history of anaphylactic reactions. No fever, or chills.    Past Medical History  Diagnosis Date  . Kidney stone   . Kidney calculi    Past Surgical History  Procedure Laterality Date  . Kidney stone surgery    . Lithotripsy     No family history on file. History  Substance Use Topics  . Smoking status: Current Every Day Smoker  . Smokeless tobacco: Not on file  . Alcohol Use: No    Review of Systems  HENT: Negative for trouble swallowing.   Respiratory: Negative for shortness of breath and wheezing.   Skin:       Area of swelling to right shoulder    Allergies  Review of patient's allergies indicates no known allergies.  Home Medications   Prior to Admission medications   Medication Sig Start Date End Date Taking? Authorizing Provider  OVER THE COUNTER MEDICATION Apply 1 application topically daily.   Yes Historical Provider, MD   Triage Vitals: BP 134/66  Pulse 57  Temp(Src) 97.8 F (36.6 C) (Oral)  Resp 18  SpO2 100%  Physical Exam  Nursing note and vitals  reviewed. Constitutional: He is oriented to person, place, and time. He appears well-developed and well-nourished. No distress.  HENT:  Head: Normocephalic and atraumatic.  No tongue or lip swelling  Eyes: Conjunctivae and EOM are normal.  Neck: Neck supple. No tracheal deviation present.  Cardiovascular: Normal rate, regular rhythm and normal heart sounds.   No murmur heard. Pulmonary/Chest: Effort normal and breath sounds normal. No respiratory distress. He has no wheezes. He has no rales.  Musculoskeletal: Normal range of motion.  Neurological: He is alert and oriented to person, place, and time.  Skin: Skin is warm and dry.  Right posterior shoulder with localized area of skin irritation with mild induration and mild surrounding erythema.  Psychiatric: He has a normal mood and affect. His behavior is normal.    ED Course  Procedures (including critical care time)  DIAGNOSTIC STUDIES: Oxygen Saturation is 100% on RA, normal by my interpretation.    COORDINATION OF CARE: 12:10 PM- Discussed that pt's area of swelling is simply localized skin irritation. Will prescribe an antibiotic to be used in the event that pt's swelling worsens and the area becomes infected. Pt advised of plan for treatment and pt agrees.  Labs Review Labs Reviewed - No data to display  Imaging Review No results found.   EKG Interpretation None      MDM  Final diagnoses:  Insect bite of shoulder with local reaction, right, initial encounter    BP 134/66  Pulse 57  Temp(Src) 97.8 F (36.6 C) (Oral)  Resp 18  SpO2 100%   I personally performed the services described in this documentation, which was scribed in my presence. The recorded information has been reviewed and is accurate.    Fayrene HelperBowie Tran, PA-C 05/02/14 1214

## 2014-05-02 NOTE — ED Notes (Signed)
Pt c/o bump on right shoulder that started yesterday, pt states he was in back yard yesterday with shirt off.

## 2014-07-09 ENCOUNTER — Emergency Department (HOSPITAL_BASED_OUTPATIENT_CLINIC_OR_DEPARTMENT_OTHER): Payer: Self-pay

## 2014-07-09 ENCOUNTER — Encounter (HOSPITAL_BASED_OUTPATIENT_CLINIC_OR_DEPARTMENT_OTHER): Payer: Self-pay | Admitting: Emergency Medicine

## 2014-07-09 ENCOUNTER — Emergency Department (HOSPITAL_BASED_OUTPATIENT_CLINIC_OR_DEPARTMENT_OTHER)
Admission: EM | Admit: 2014-07-09 | Discharge: 2014-07-10 | Disposition: A | Discharge status: Home / Self Care | Payer: Self-pay | Attending: Emergency Medicine | Admitting: Emergency Medicine

## 2014-07-09 DIAGNOSIS — Z792 Long term (current) use of antibiotics: Secondary | ICD-10-CM | POA: Insufficient documentation

## 2014-07-09 DIAGNOSIS — M25569 Pain in unspecified knee: Secondary | ICD-10-CM | POA: Insufficient documentation

## 2014-07-09 DIAGNOSIS — F17200 Nicotine dependence, unspecified, uncomplicated: Secondary | ICD-10-CM | POA: Insufficient documentation

## 2014-07-09 DIAGNOSIS — Z87442 Personal history of urinary calculi: Secondary | ICD-10-CM | POA: Insufficient documentation

## 2014-07-09 DIAGNOSIS — L02419 Cutaneous abscess of limb, unspecified: Secondary | ICD-10-CM | POA: Insufficient documentation

## 2014-07-09 DIAGNOSIS — L03119 Cellulitis of unspecified part of limb: Principal | ICD-10-CM

## 2014-07-09 DIAGNOSIS — L03116 Cellulitis of left lower limb: Secondary | ICD-10-CM

## 2014-07-09 NOTE — ED Notes (Signed)
Pt reports pain and swelling to left knee. Pain with walking.

## 2014-07-10 MED ORDER — TRAMADOL HCL 50 MG PO TABS: 50.0000 mg | ORAL_TABLET | Freq: Four times a day (QID) | ORAL | Status: DC | PRN

## 2014-07-10 MED ORDER — DOXYCYCLINE HYCLATE 100 MG PO TABS
100.0000 mg | ORAL_TABLET | Freq: Once | ORAL | Status: AC
  Administered 2014-07-10: 100 mg via ORAL
  Filled 2014-07-10: qty 1

## 2014-07-10 MED ORDER — DOXYCYCLINE HYCLATE 100 MG PO CAPS: 100.0000 mg | ORAL_CAPSULE | Freq: Two times a day (BID) | ORAL | Status: DC

## 2014-07-10 NOTE — ED Provider Notes (Signed)
CSN: [id number]     Arrival date & time 07/09/14  2252 History   First MD Initiated Contact with Patient 07/10/14 0033     Chief Complaint  Patient presents with  . Knee Pain     (Consider location/radiation/quality/duration/timing/severity/associated sxs/prior Treatment) Patient is a 38 y.o. male presenting with knee pain. The history is provided by the patient.  Knee Pain He noted pain and swelling in his left knee over the last 2 days. Denies history of trauma. He rates pain in his knee at 8/10. It is worse with movement but he states he is able to ambulate difficulty. He denies fever, chills, sweats. Nothing makes it feel any better. Of note, he does have a history of cellulitis and MRSA infections.  Past Medical History  Diagnosis Date  . Kidney stone   . Kidney calculi    Past Surgical History  Procedure Laterality Date  . Kidney stone surgery    . Lithotripsy     No family history on file. History  Substance Use Topics  . Smoking status: Current Every Day Smoker  . Smokeless tobacco: Not on file  . Alcohol Use: No    Review of Systems  All other systems reviewed and are negative.     Allergies  Review of patient's allergies indicates no known allergies.  Home Medications   Prior to Admission medications   Medication Sig Start Date End Date Taking? Authorizing Provider  cephALEXin (KEFLEX) 500 MG capsule Take 1 capsule (500 mg total) by mouth 4 (four) times daily. 05/02/14   Fayrene Helper, PA-C  OVER THE COUNTER MEDICATION Apply 1 application topically daily.    Historical Provider, MD   BP 135/94  Pulse 63  Temp(Src) 98.7 F (37.1 C) (Oral)  Resp 18  Ht  (1.753 m)  Wt 165 lb (74.844 kg)  BMI 24.36 kg/m2  SpO2 100% Physical Exam  Nursing note and vitals reviewed.  38 year old male, resting comfortably and in no acute distress. Vital signs are significant for mild hypertension. Oxygen saturation is 100%, which is normal. Head is normocephalic and  atraumatic. PERRLA, EOMI. Oropharynx is clear. Neck is nontender and supple without adenopathy or JVD. Back is nontender and there is no CVA tenderness. Lungs are clear without rales, wheezes, or rhonchi. Chest is nontender. Heart has regular rate and rhythm without murmur. Abdomen is soft, flat, nontender without masses or hepatosplenomegaly and peristalsis is normoactive. Extremities have no cyanosis or edema, full range of motion is present. There is mild erythema and slight warmth of the anterior aspect of the left knee. 2 small vesicles are present but are not pustules. There is no effusion. Skin is warm and dry without rash. Neurologic: Mental status is normal, cranial nerves are intact, there are no motor or sensory deficits.  ED Course  Procedures (including critical care time) Imaging Review Dg Knee Complete 4 Views Left  07/09/2014   CLINICAL DATA:  Sudden onset left knee pain and swelling.  EXAM: LEFT KNEE - COMPLETE 4+ VIEW  COMPARISON:  None.  FINDINGS: Mild diffuse subcutaneous edema and medially and mild diffuse soft tissue swelling anteriorly in the infrapatellar region. Normal appearing bones. No effusion.  IMPRESSION: Soft tissue swelling without underlying bony abnormality or effusion.   Electronically Signed   By: Gordan Payment M.D.   On: 07/09/2014 23:18    MDM   Final diagnoses:  Cellulitis of left knee    Cellulitis of the left knee. He is discharged with  prescription for doxycycline and tramadol for pain. He is use over-the-counter analgesics for less severe pain. Old records reviewed and he has several prior ED visits for cellulitis and various locations.    Dione Booze, MD 07/10/14 [phone]

## 2014-07-10 NOTE — Discharge Instructions (Signed)
Cellulitis Cellulitis is an infection of the skin and the tissue beneath it. The infected area is usually red and tender. Cellulitis occurs most often in the arms and lower legs.  CAUSES  Cellulitis is caused by bacteria that enter the skin through cracks or cuts in the skin. The most common types of bacteria that cause cellulitis are staphylococci and streptococci. SIGNS AND SYMPTOMS   Redness and warmth.  Swelling.  Tenderness or pain.  Fever. DIAGNOSIS  Your health care provider can usually determine what is wrong based on a physical exam. Blood tests may also be done. TREATMENT  Treatment usually involves taking an antibiotic medicine. HOME CARE INSTRUCTIONS   Take your antibiotic medicine as directed by your health care provider. Finish the antibiotic even if you start to feel better.  Keep the infected arm or leg elevated to reduce swelling.  Apply a warm cloth to the affected area up to 4 times per day to relieve pain.  Take medicines only as directed by your health care provider.  Keep all follow-up visits as directed by your health care provider. SEEK MEDICAL CARE IF:   You notice red streaks coming from the infected area.  Your red area gets larger or turns dark in color.  Your bone or joint underneath the infected area becomes painful after the skin has healed.  Your infection returns in the same area or another area.  You notice a swollen bump in the infected area.  You develop new symptoms.  You have a fever. SEEK IMMEDIATE MEDICAL CARE IF:   You feel very sleepy.  You develop vomiting or diarrhea.  You have a general ill feeling (malaise) with muscle aches and pains. MAKE SURE YOU:   Understand these instructions.  Will watch your condition.  Will get help right away if you are not doing well or get worse. Document Released: 08/10/2005 Document Revised: 03/17/2014 Document Reviewed: 01/16/2012 [facility] Patient Information 2015 [place], [place].  This information is not intended to replace advice given to you by your health care provider. Make sure you discuss any questions you have with your health care provider.  Doxycycline tablets or capsules What is this medicine? DOXYCYCLINE (dox i SYE kleen) is a tetracycline antibiotic. It kills certain bacteria or stops their growth. It is used to treat many kinds of infections, like dental, skin, respiratory, and urinary tract infections. It also treats acne, Lyme disease, malaria, and certain sexually transmitted infections. This medicine may be used for other purposes; ask your health care provider or pharmacist if you have questions. COMMON BRAND NAME(S): Acticlate, Adoxa, Adoxa CK, Adoxa Pak, Adoxa TT, Alodox, Avidoxy, Doxal, Monodox, Morgidox 1x, Morgidox 1x Kit, Morgidox 2x, Morgidox 2x Kit, Ocudox, Vibra-Tabs, Vibramycin What should I tell my health care provider before I take this medicine? They need to know if you have any of these conditions: -liver disease -long exposure to sunlight like working outdoors -stomach problems like colitis -an unusual or allergic reaction to doxycycline, tetracycline antibiotics, other medicines, foods, dyes, or preservatives -pregnant or trying to get pregnant -breast-feeding How should I use this medicine? Take this medicine by mouth with a full glass of water. Follow the directions on the prescription label. It is best to take this medicine without food, but if it upsets your stomach take it with food. Take your medicine at regular intervals. Do not take your medicine more often than directed. Take all of your medicine as directed even if you think you are better. Do not  skip doses or stop your medicine early. Talk to your pediatrician regarding the use of this medicine in children. Special care may be needed. While this drug may be prescribed for children as young as 53 years old for selected conditions, precautions do apply. Overdosage: If you think you  have taken too much of this medicine contact a poison control center or emergency room at once. NOTE: This medicine is only for you. Do not share this medicine with others. What if I miss a dose? If you miss a dose, take it as soon as you can. If it is almost time for your next dose, take only that dose. Do not take double or extra doses. What may interact with this medicine? -antacids -barbiturates -birth control pills -bismuth subsalicylate -carbamazepine -methoxyflurane -other antibiotics -phenytoin -vitamins that contain iron -warfarin This list may not describe all possible interactions. Give your health care provider a list of all the medicines, herbs, non-prescription drugs, or dietary supplements you use. Also tell them if you smoke, drink alcohol, or use illegal drugs. Some items may interact with your medicine. What should I watch for while using this medicine? Tell your doctor or health care professional if your symptoms do not improve. Do not treat diarrhea with over the counter products. Contact your doctor if you have diarrhea that lasts more than 2 days or if it is severe and watery. Do not take this medicine just before going to bed. It may not dissolve properly when you lay down and can cause pain in your throat. Drink plenty of fluids while taking this medicine to also help reduce irritation in your throat. This medicine can make you more sensitive to the sun. Keep out of the sun. If you cannot avoid being in the sun, wear protective clothing and use sunscreen. Do not use sun lamps or tanning beds/booths. Birth control pills may not work properly while you are taking this medicine. Talk to your doctor about using an extra method of birth control. If you are being treated for a sexually transmitted infection, avoid sexual contact until you have finished your treatment. Your sexual partner may also need treatment. Avoid antacids, aluminum, calcium, magnesium, and iron products  for 4 hours before and 2 hours after taking a dose of this medicine. If you are using this medicine to prevent malaria, you should still protect yourself from contact with mosquitos. Stay in screened-in areas, use mosquito nets, keep your body covered, and use an insect repellent. What side effects may I notice from receiving this medicine? Side effects that you should report to your doctor or health care professional as soon as possible: -allergic reactions like skin rash, itching or hives, swelling of the face, lips, or tongue -difficulty breathing -fever -itching in the rectal or genital area -pain on swallowing -redness, blistering, peeling or loosening of the skin, including inside the mouth -severe stomach pain or cramps -unusual bleeding or bruising -unusually weak or tired -yellowing of the eyes or skin Side effects that usually do not require medical attention (report to your doctor or health care professional if they continue or are bothersome): -diarrhea -loss of appetite -nausea, vomiting This list may not describe all possible side effects. Call your doctor for medical advice about side effects. You may report side effects to FDA at 1-800-FDA-1088. Where should I keep my medicine? Keep out of the reach of children. Store at room temperature, below 30 degrees C (86 degrees F). Protect from light. Keep container tightly closed. Throw  away any unused medicine after the expiration date. Taking this medicine after the expiration date can make you seriously ill. NOTE: This sheet is a summary. It may not cover all possible information. If you have questions about this medicine, talk to your doctor, pharmacist, or health care provider.  2015, [employer] Standard. (2013-09-06 13:58:06)  Tramadol tablets What is this medicine? TRAMADOL (TRA ma dole) is a pain reliever. It is used to treat moderate to severe pain in adults. This medicine may be used for other purposes; ask your  health care provider or pharmacist if you have questions. COMMON BRAND NAME(S): Ultram What should I tell my health care provider before I take this medicine? They need to know if you have any of these conditions: -brain tumor -depression -drug abuse or addiction -head injury -if you frequently drink alcohol containing drinks -kidney disease or trouble passing urine -liver disease -lung disease, asthma, or breathing problems -seizures or epilepsy -suicidal thoughts, plans, or attempt; a previous suicide attempt by you or a family member -an unusual or allergic reaction to tramadol, codeine, other medicines, foods, dyes, or preservatives -pregnant or trying to get pregnant -breast-feeding How should I use this medicine? Take this medicine by mouth with a full glass of water. Follow the directions on the prescription label. If the medicine upsets your stomach, take it with food or milk. Do not take more medicine than you are told to take. Talk to your pediatrician regarding the use of this medicine in children. Special care may be needed. Overdosage: If you think you have taken too much of this medicine contact a poison control center or emergency room at once. NOTE: This medicine is only for you. Do not share this medicine with others. What if I miss a dose? If you miss a dose, take it as soon as you can. If it is almost time for your next dose, take only that dose. Do not take double or extra doses. What may interact with this medicine? Do not take this medicine with any of the following medications: -MAOIs like Carbex, Eldepryl, Marplan, Nardil, and Parnate This medicine may also interact with the following medications: -alcohol or medicines that contain alcohol -antihistamines -benzodiazepines -bupropion -carbamazepine or oxcarbazepine -clozapine -cyclobenzaprine -digoxin -furazolidone -linezolid -medicines for depression, anxiety, or psychotic disturbances -medicines for  migraine headache like almotriptan, eletriptan, frovatriptan, naratriptan, rizatriptan, sumatriptan, zolmitriptan -medicines for pain like pentazocine, buprenorphine, butorphanol, meperidine, nalbuphine, and propoxyphene -medicines for sleep -muscle relaxants -naltrexone -phenobarbital -phenothiazines like perphenazine, thioridazine, chlorpromazine, mesoridazine, fluphenazine, prochlorperazine, promazine, and trifluoperazine -procarbazine -warfarin This list may not describe all possible interactions. Give your health care provider a list of all the medicines, herbs, non-prescription drugs, or dietary supplements you use. Also tell them if you smoke, drink alcohol, or use illegal drugs. Some items may interact with your medicine. What should I watch for while using this medicine? Tell your doctor or health care professional if your pain does not go away, if it gets worse, or if you have new or a different type of pain. You may develop tolerance to the medicine. Tolerance means that you will need a higher dose of the medicine for pain relief. Tolerance is normal and is expected if you take this medicine for a long time. Do not suddenly stop taking your medicine because you may develop a severe reaction. Your body becomes used to the medicine. This does NOT mean you are addicted. Addiction is a behavior related to getting and using a drug for a  non-medical reason. If you have pain, you have a medical reason to take pain medicine. Your doctor will tell you how much medicine to take. If your doctor wants you to stop the medicine, the dose will be slowly lowered over time to avoid any side effects. You may get drowsy or dizzy. Do not drive, use machinery, or do anything that needs mental alertness until you know how this medicine affects you. Do not stand or sit up quickly, especially if you are an older patient. This reduces the risk of dizzy or fainting spells. Alcohol can increase or decrease the effects  of this medicine. Avoid alcoholic drinks. You may have constipation. Try to have a bowel movement at least every 2 to 3 days. If you do not have a bowel movement for 3 days, call your doctor or health care professional. Your mouth may get dry. Chewing sugarless gum or sucking hard candy, and drinking plenty of water may help. Contact your doctor if the problem does not go away or is severe. What side effects may I notice from receiving this medicine? Side effects that you should report to your doctor or health care professional as soon as possible: -allergic reactions like skin rash, itching or hives, swelling of the face, lips, or tongue -breathing difficulties, wheezing -confusion -itching -light headedness or fainting spells -redness, blistering, peeling or loosening of the skin, including inside the mouth -seizures Side effects that usually do not require medical attention (report to your doctor or health care professional if they continue or are bothersome): -constipation -dizziness -drowsiness -headache -nausea, vomiting This list may not describe all possible side effects. Call your doctor for medical advice about side effects. You may report side effects to FDA at 1-800-FDA-1088. Where should I keep my medicine? Keep out of the reach of children. Store at room temperature between 15 and 30 degrees C (59 and 86 degrees F). Keep container tightly closed. Throw away any unused medicine after the expiration date. NOTE: This sheet is a summary. It may not cover all possible information. If you have questions about this medicine, talk to your doctor, pharmacist, or health care provider.  2015, Elsevier/Gold Standard. (2010-07-14 11:55:44)

## 2014-10-02 ENCOUNTER — Encounter (HOSPITAL_BASED_OUTPATIENT_CLINIC_OR_DEPARTMENT_OTHER): Payer: Self-pay | Admitting: Emergency Medicine

## 2014-10-02 ENCOUNTER — Emergency Department (HOSPITAL_BASED_OUTPATIENT_CLINIC_OR_DEPARTMENT_OTHER)
Admission: EM | Admit: 2014-10-02 | Discharge: 2014-10-03 | Disposition: A | Discharge status: Home / Self Care | Payer: Self-pay | Attending: Emergency Medicine | Admitting: Emergency Medicine

## 2014-10-02 DIAGNOSIS — Z720 Tobacco use: Secondary | ICD-10-CM | POA: Insufficient documentation

## 2014-10-02 DIAGNOSIS — L03116 Cellulitis of left lower limb: Secondary | ICD-10-CM | POA: Insufficient documentation

## 2014-10-02 DIAGNOSIS — Z87442 Personal history of urinary calculi: Secondary | ICD-10-CM | POA: Insufficient documentation

## 2014-10-02 NOTE — ED Notes (Signed)
Pt states his leg started swelling two days ago, can't remember what he was doing when it started. Denies any injury.

## 2014-10-02 NOTE — ED Notes (Signed)
abscess to left thing x 2 days

## 2014-10-03 LAB — CBG MONITORING, ED: Glucose-Capillary: 110 mg/dL — ABNORMAL HIGH (ref 70–99)

## 2014-10-03 MED ORDER — SULFAMETHOXAZOLE-TRIMETHOPRIM 800-160 MG PO TABS: 1.0000 | ORAL_TABLET | Freq: Two times a day (BID) | ORAL | Status: DC

## 2014-10-03 NOTE — ED Provider Notes (Signed)
CSN: [id number]     Arrival date & time 10/02/14  2154 History   First MD Initiated Contact with Patient 10/02/14 2359     Chief Complaint  Patient presents with  . Leg Swelling      Patient is a 38 y.o. male presenting with abscess. The history is provided by the patient.  Abscess Location:  Leg Leg abscess location:  L upper leg Red streaking: no   Duration:  2 days Progression:  Worsening Chronicity:  New Context: not diabetes   Relieved by:  Warm compresses Worsened by:  Nothing tried Associated symptoms: no fever and no vomiting   Risk factors: prior abscess     Past Medical History  Diagnosis Date  . Kidney stone   . Kidney calculi    Past Surgical History  Procedure Laterality Date  . Kidney stone surgery    . Lithotripsy     History reviewed. No pertinent family history. History  Substance Use Topics  . Smoking status: Current Every Day Smoker  . Smokeless tobacco: Not on file  . Alcohol Use: No    Review of Systems  Constitutional: Negative for fever.  Respiratory: Negative for cough.   Gastrointestinal: Negative for vomiting.  Musculoskeletal: Negative for back pain.  All other systems reviewed and are negative.     Allergies  Review of patient's allergies indicates no known allergies.  Home Medications   Prior to Admission medications   Medication Sig Start Date End Date Taking? Authorizing Provider  sulfamethoxazole-trimethoprim (SEPTRA DS) 800-160 MG per tablet Take 1 tablet by mouth every 12 (twelve) hours. 10/03/14   Joya Gaskinsonald W Frenchie Dangerfield, MD   BP 125/70 mmHg  Pulse 58  Temp(Src) 98.1 F (36.7 C) (Oral)  Resp 18  Ht 5\' 9"  (1.753 m)  Wt 170 lb (77.111 kg)  BMI 25.09 kg/m2  SpO2 100% Physical Exam CONSTITUTIONAL: Well developed/well nourished HEAD: Normocephalic/atraumatic EYES: EOMI ENMT: Mucous membranes moist NECK: supple no meningeal signs CV: S1/S2 noted, no murmurs/rubs/gallops noted LUNGS: Lungs are clear to auscultation  bilaterally, no apparent distress ABDOMEN: soft, nontender, no rebound or guarding, bowel sounds noted throughout abdomen NEURO: Pt is awake/alert/appropriate, moves all extremitiesx4.  No facial droop.   EXTREMITIES: pulses normal/equal, full ROM SKIN: warm, color normal. Small area of erythema/induration without fluctuance to left distal/lateral thigh.  No crepitus.  No streaking.  No drainage. PSYCH: no abnormalities of mood noted, alert and oriented to situation  ED Course  Procedures  Labs Review Labs Reviewed  CBG MONITORING, ED - Abnormal; Notable for the following:    Glucose-Capillary 110 (*)    All other components within normal limits    This is not amenable to I&D at this time Patient is placed on bactrim for overlying cellulitis Discussed importance of keeping area clean/dry and use of warm compresses Pt is agreeable with plan  MDM   Final diagnoses:  Cellulitis of left thigh    Nursing notes including past medical history and social history reviewed and considered in documentation Labs/vital reviewed myself and considered during evaluation     Joya Gaskinsonald W Drusilla Wampole, MD 10/03/14 [phone]

## 2015-03-19 IMAGING — CR DG KNEE COMPLETE 4+V*L*
4 series · 4 of 4 positions shown · non-contrast
Comparison: None.

CLINICAL DATA: Sudden onset left knee pain and swelling.

EXAM:
LEFT KNEE - COMPLETE 4+ VIEW

[t knee ap left]
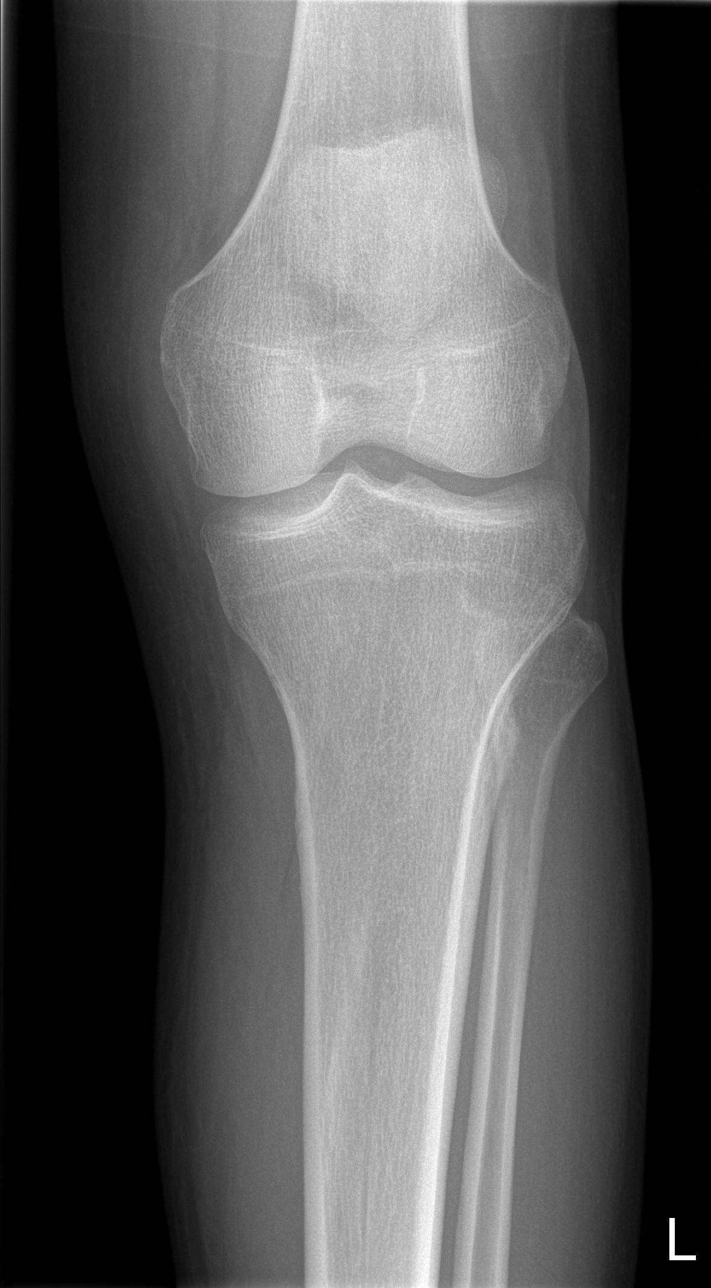

[t knee oblique left (1 of 2)]
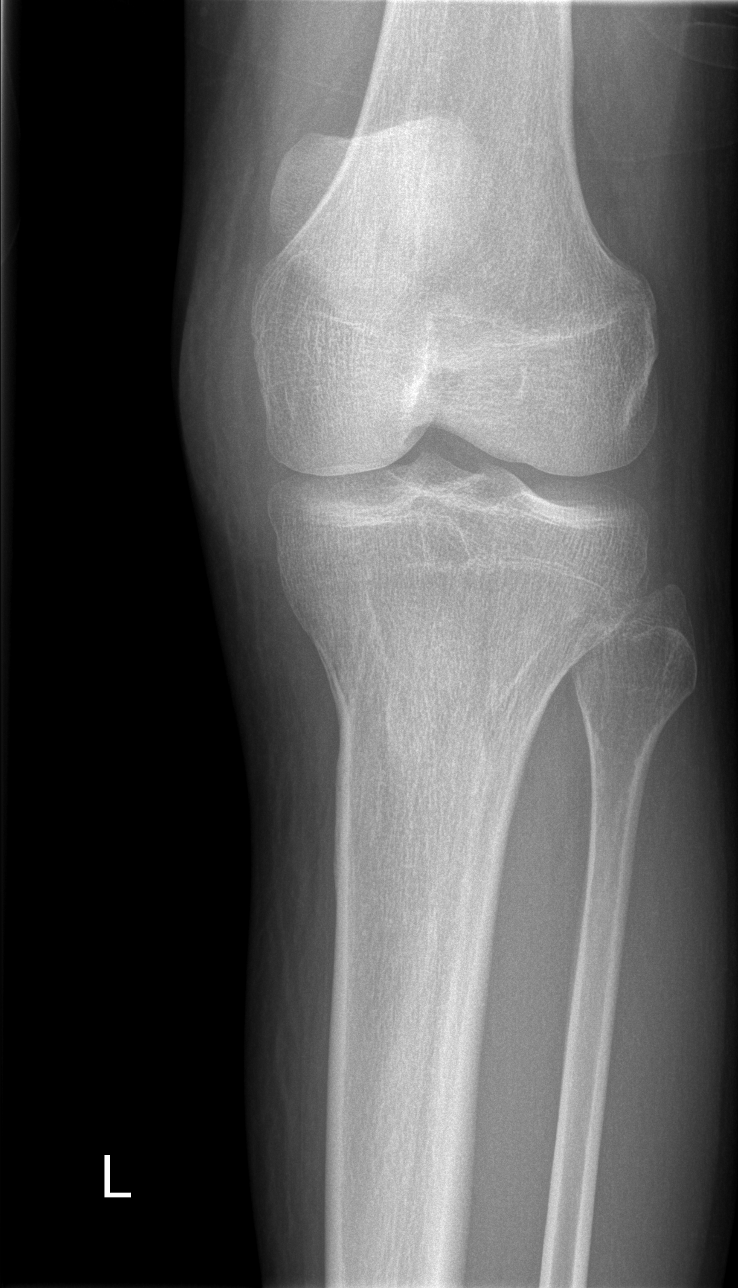

[t knee oblique left (2 of 2)]
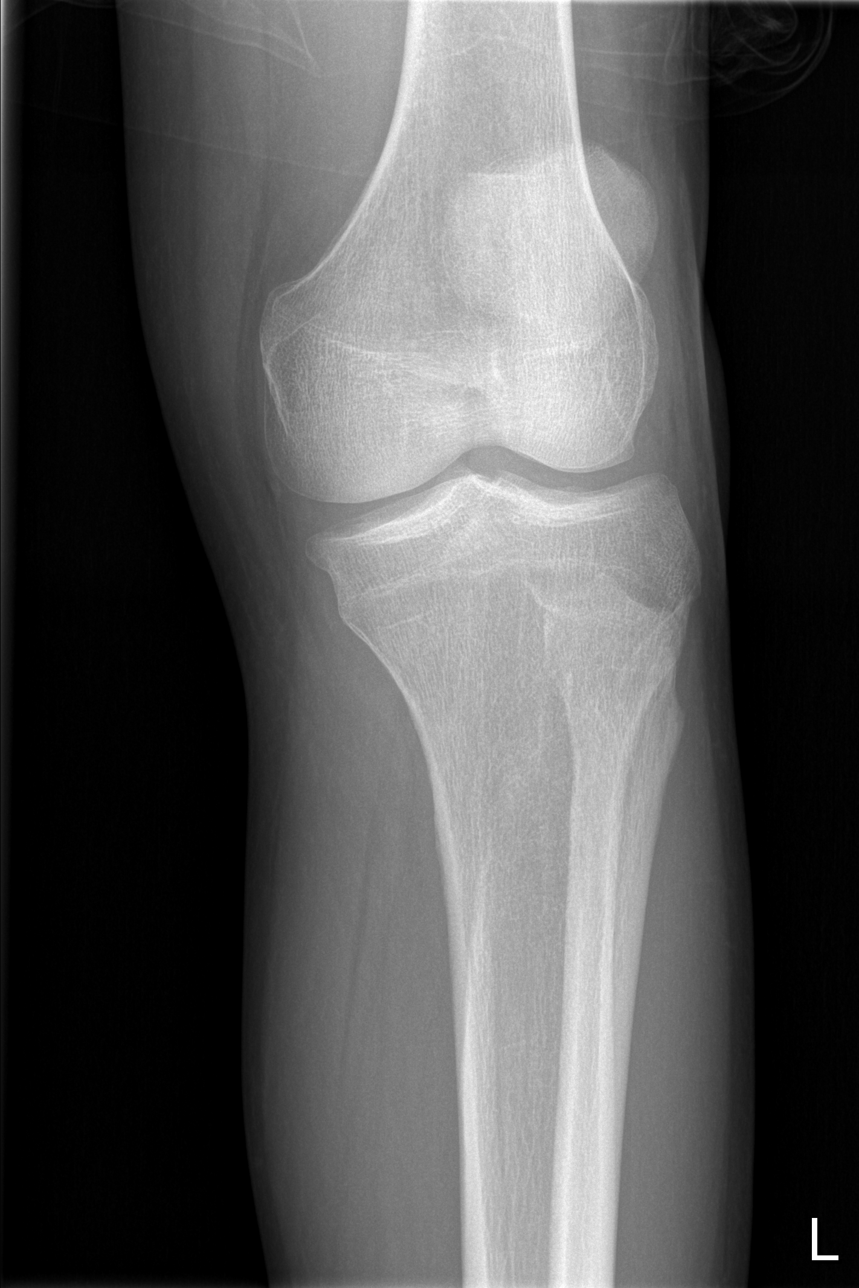

[t knee lat left]
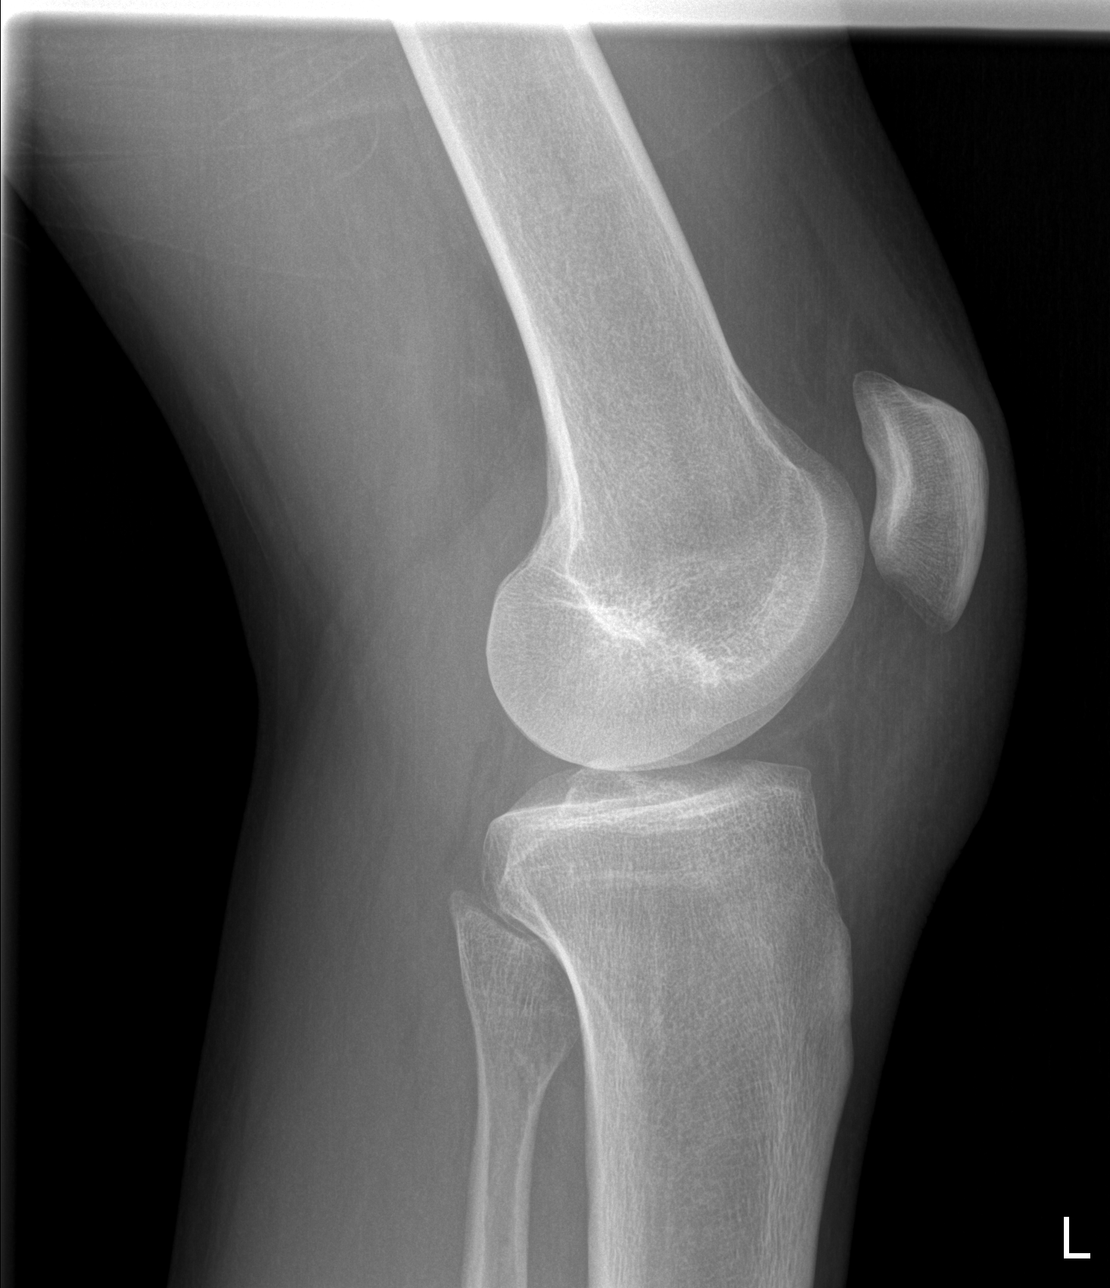

[4 of 4 positions shown; findings below may reference images not displayed]

FINDINGS: Mild diffuse subcutaneous edema and medially and mild diffuse soft
tissue swelling anteriorly in the infrapatellar region. Normal
appearing bones. No effusion.
IMPRESSION: Soft tissue swelling without underlying bony abnormality or
effusion.

## 2015-09-27 ENCOUNTER — Encounter (HOSPITAL_COMMUNITY): Payer: Self-pay | Admitting: *Deleted

## 2015-09-27 ENCOUNTER — Emergency Department (HOSPITAL_COMMUNITY)
Admission: EM | Admit: 2015-09-27 | Discharge: 2015-09-27 | Disposition: A | Discharge status: Home / Self Care | Payer: Self-pay | Source: Home / Self Care

## 2015-09-27 DIAGNOSIS — Z202 Contact with and (suspected) exposure to infections with a predominantly sexual mode of transmission: Secondary | ICD-10-CM

## 2015-09-27 MED ORDER — METRONIDAZOLE 500 MG PO TABS: 500.0000 mg | ORAL_TABLET | Freq: Two times a day (BID) | ORAL | Status: DC
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
## 2015-09-27 NOTE — Discharge Instructions (Signed)
Take all of medicine as prescribed.

## 2015-09-27 NOTE — ED Provider Notes (Signed)
CSN: [id number]     Arrival date & time 09/27/15  1559 History   None    Chief Complaint  Patient presents with  . SEXUALLY TRANSMITTED DISEASE   (Consider location/radiation/quality/duration/timing/severity/associated sxs/prior Treatment) Patient is a 39 y.o. male presenting with STD exposure. The history is provided by the patient.  Exposure to STD This is a new problem. The current episode started 2 days ago. The problem has not changed since onset.Associated symptoms comments: Girlfriend with trichomonas, here for rx. No sx,.    Past Medical History  Diagnosis Date  . Kidney stone   . Kidney calculi    Past Surgical History  Procedure Laterality Date  . Kidney stone surgery    . Lithotripsy     History reviewed. No pertinent family history. Social History  Substance Use Topics  . Smoking status: Current Every Day Smoker  . Smokeless tobacco: None  . Alcohol Use: No    Review of Systems  Constitutional: Negative.   Gastrointestinal: Negative.   Genitourinary: Negative.   All other systems reviewed and are negative.   Allergies  Review of patient's allergies indicates no known allergies.  Home Medications   Prior to Admission medications   Medication Sig Start Date End Date Taking? Authorizing Provider  metroNIDAZOLE (FLAGYL) 500 MG tablet Take 1 tablet (500 mg total) by mouth 2 (two) times daily. 09/27/15   Linna HoffJames D Doylene Splinter, MD  sulfamethoxazole-trimethoprim (SEPTRA DS) 800-160 MG per tablet Take 1 tablet by mouth every 12 (twelve) hours. 10/03/14   Zadie Rhineonald Wickline, MD   Meds Ordered and Administered this Visit  Medications - No data to display  BP 180/82 mmHg  Pulse 62  Temp(Src) 98.1 F (36.7 C) (Oral)  SpO2 99% No data found.   Physical Exam  Constitutional: He is oriented to person, place, and time. He appears well-developed and well-nourished.  Abdominal: Soft. Bowel sounds are normal. There is no tenderness.  Neurological: He is alert and oriented  to person, place, and time.  Skin: Skin is warm and dry.  Nursing note and vitals reviewed.   ED Course  Procedures (including critical care time)  Labs Review Labs Reviewed - No data to display  Imaging Review No results found.   Visual Acuity Review  Right Eye Distance:   Left Eye Distance:   Bilateral Distance:    Right Eye Near:   Left Eye Near:    Bilateral Near:         MDM   1. Trichomonas exposure        Linna HoffJames D Finnbar Cedillos, MD 09/27/15 1651

## 2018-04-20 ENCOUNTER — Encounter (HOSPITAL_COMMUNITY): Payer: Self-pay | Admitting: Family Medicine

## 2018-04-20 ENCOUNTER — Ambulatory Visit (HOSPITAL_COMMUNITY)
Admission: EM | Admit: 2018-04-20 | Discharge: 2018-04-20 | Disposition: A | Discharge status: Home / Self Care | Payer: Self-pay | Attending: Family Medicine | Admitting: Family Medicine

## 2018-04-20 DIAGNOSIS — L02412 Cutaneous abscess of left axilla: Secondary | ICD-10-CM

## 2018-04-20 MED ORDER — SULFAMETHOXAZOLE-TRIMETHOPRIM 800-160 MG PO TABS: 1.0000 | ORAL_TABLET | Freq: Two times a day (BID) | ORAL | 0 refills | Status: AC

## 2018-04-20 NOTE — ED Provider Notes (Signed)
[facility]    CSN: [id number] Arrival date & time: 04/20/18  1818     History   Chief Complaint Chief Complaint  Patient presents with  . Abscess    HPI Vincent Riley is a 42 y.o. male.   HPI  Abscess left axilla present 3 days Growing and painful Wants treatment Otherwise healthy  Past Medical History:  Diagnosis Date  . Kidney calculi   . Kidney stone     There are no active problems to display for this patient.   Past Surgical History:  Procedure Laterality Date  . KIDNEY STONE SURGERY    . LITHOTRIPSY         Home Medications    Prior to Admission medications   Medication Sig Start Date End Date Taking? Authorizing Provider  sulfamethoxazole-trimethoprim (BACTRIM DS,SEPTRA DS) 800-160 MG tablet Take 1 tablet by mouth 2 (two) times daily for 7 days. 04/20/18 04/27/18  Eustace MooreNelson, Aryka Coonradt Sue, MD    Family History History reviewed. No pertinent family history.  Social History Social History   Tobacco Use  . Smoking status: Current Every Day Smoker  Substance Use Topics  . Alcohol use: No  . Drug use: No     Allergies   Patient has no known allergies.   Review of Systems Review of Systems  Constitutional: Negative for chills and fever.  HENT: Negative for ear pain and sore throat.   Eyes: Negative for pain and visual disturbance.  Respiratory: Negative for cough and shortness of breath.   Cardiovascular: Negative for chest pain and palpitations.  Gastrointestinal: Negative for abdominal pain and vomiting.  Genitourinary: Negative for dysuria and hematuria.  Musculoskeletal: Negative for arthralgias and back pain.  Skin: Positive for wound. Negative for color change and rash.  Neurological: Negative for seizures and syncope.  All other systems reviewed and are negative.    Physical Exam Triage Vital Signs ED Triage Vitals  Enc Vitals Group     BP 04/20/18 1832 133/74     Pulse Rate 04/20/18 1832 68     Resp 04/20/18 1832  18     Temp 04/20/18 1832 98.2 F (36.8 C)     Temp src --      SpO2 04/20/18 1832 99 %     Weight --      Height --      Head Circumference --      Peak Flow --      Pain Score 04/20/18 1831 6     Pain Loc --      Pain Edu? --      Excl. in GC? --    No data found.  Updated Vital Signs BP 133/74   Pulse 68   Temp 98.2 F (36.8 C)   Resp 18   SpO2 99%   Visual Acuity Right Eye Distance:   Left Eye Distance:   Bilateral Distance:    Right Eye Near:   Left Eye Near:    Bilateral Near:     Physical Exam  Constitutional: He appears well-developed and well-nourished. No distress.  HENT:  Head: Normocephalic and atraumatic.  Mouth/Throat: Oropharynx is clear and moist.  Eyes: Pupils are equal, round, and reactive to light. Conjunctivae are normal.  Neck: Normal range of motion.  Cardiovascular: Normal rate.  Pulmonary/Chest: Effort normal. No respiratory distress.  Abdominal: Soft. He exhibits no distension.  Musculoskeletal: Normal range of motion. He exhibits no edema.  Neurological: He is alert.  Skin: Skin is warm  and dry.  Left axilla has a nodule, red, tender measures 4 cm across.  Cleaned with betadine, anesth with lidocaine wheal and 18 ga needle failed to return  purulence.     UC Treatments / Results  Labs (all labs ordered are listed, but only abnormal results are displayed) Labs Reviewed - No data to display  EKG None  Radiology No results found.  Procedures Procedures (including critical care time)  Medications Ordered in UC Medications - No data to display  Initial Impression / Assessment and Plan / UC Course  I have reviewed the triage vital signs and the nursing notes.  Pertinent labs & imaging results that were available during my care of the patient were reviewed by me and considered in my medical decision making (see chart for details).      Final Clinical Impressions(s) / UC Diagnoses   Final diagnoses:  Abscess of left  axilla     Discharge Instructions     Warm compresses Ibuprofen for pain Antibiotic 2 x a day Return if needed- if this drains on its own or goes away, you do not need to return.  If it grows and will not drain, It may need to be lanced.   ED Prescriptions    Medication Sig Dispense Auth. Provider   sulfamethoxazole-trimethoprim (BACTRIM DS,SEPTRA DS) 800-160 MG tablet Take 1 tablet by mouth 2 (two) times daily for 7 days. 14 tablet Eustace Moore, MD     Controlled Substance Prescriptions [place] Controlled Substance Registry consulted? Not Applicable   Eustace Moore, MD 04/20/18 1901

## 2018-04-20 NOTE — ED Triage Notes (Signed)
Pt here for abscess under the left axilla. It has been there for 3 days. He has been using warm compresses with some relief.

## 2018-04-20 NOTE — Discharge Instructions (Signed)
Warm compresses Ibuprofen for pain Antibiotic 2 x a day Return if needed- if this drains on its own or goes away, you do not need to return.  If it grows and will not drain, It may need to be lanced.

## 2019-12-25 ENCOUNTER — Emergency Department (HOSPITAL_BASED_OUTPATIENT_CLINIC_OR_DEPARTMENT_OTHER)
Admission: EM | Admit: 2019-12-25 | Discharge: 2019-12-25 | Disposition: A | Discharge status: Home / Self Care | Payer: Self-pay | Attending: Emergency Medicine | Admitting: Emergency Medicine

## 2019-12-25 ENCOUNTER — Encounter (HOSPITAL_BASED_OUTPATIENT_CLINIC_OR_DEPARTMENT_OTHER): Payer: Self-pay | Admitting: *Deleted

## 2019-12-25 ENCOUNTER — Other Ambulatory Visit: Payer: Self-pay

## 2019-12-25 ENCOUNTER — Emergency Department (HOSPITAL_BASED_OUTPATIENT_CLINIC_OR_DEPARTMENT_OTHER): Payer: Self-pay

## 2019-12-25 DIAGNOSIS — U071 COVID-19: Secondary | ICD-10-CM | POA: Insufficient documentation

## 2019-12-25 DIAGNOSIS — M79641 Pain in right hand: Secondary | ICD-10-CM

## 2019-12-25 DIAGNOSIS — R509 Fever, unspecified: Secondary | ICD-10-CM

## 2019-12-25 DIAGNOSIS — F17210 Nicotine dependence, cigarettes, uncomplicated: Secondary | ICD-10-CM | POA: Insufficient documentation
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
## 2019-12-25 NOTE — Discharge Instructions (Signed)
Your imaging today showed no fracture dislocation but did show the soft tissue swelling.  I suspect you have soft tissue injury and hematoma on the hand however as we discussed, small fractures can be missed on additional imaging.  Please use the wrist brace to help support your hand and wrist and use over-the-counter pain medication.  You had a fever and we will test you for coronavirus.  Please follow-up with my chart or PCP for this result.  They will likely call you if it is positive.  Please follow-up with a PCP.  Included is the number to tell follow-up with a hand surgeon if your right hand gives you further trouble.  If any symptoms change or worsen, please return to the nearest emergency department.
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
## 2019-12-26 LAB — SARS CORONAVIRUS 2 (TAT 6-24 HRS): SARS Coronavirus 2: POSITIVE — AB

## 2020-03-25 ENCOUNTER — Other Ambulatory Visit: Payer: Self-pay

## 2020-03-25 ENCOUNTER — Encounter: Payer: Self-pay | Admitting: Plastic Surgery

## 2020-03-25 ENCOUNTER — Ambulatory Visit (INDEPENDENT_AMBULATORY_CARE_PROVIDER_SITE_OTHER): Payer: Self-pay | Admitting: Plastic Surgery

## 2020-03-25 VITALS — BP 121/68 | HR 55 | Temp 97.5°F | Ht 69.0 in | Wt 166.2 lb

## 2020-03-25 DIAGNOSIS — M25531 Pain in right wrist: Secondary | ICD-10-CM

## 2020-03-25 NOTE — Addendum Note (Signed)
Addended by: Allena Napoleon on: 03/25/2020 05:13 PM   Modules accepted: Orders
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
## 2020-04-29 ENCOUNTER — Inpatient Hospital Stay: Admission: RE | Admit: 2020-04-29 | Payer: Self-pay | Source: Ambulatory Visit
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
## 2021-03-16 ENCOUNTER — Ambulatory Visit (HOSPITAL_COMMUNITY)
Admission: EM | Admit: 2021-03-16 | Discharge: 2021-03-16 | Disposition: A | Discharge status: Home / Self Care | Payer: Self-pay | Attending: Student | Admitting: Student

## 2021-03-16 ENCOUNTER — Other Ambulatory Visit: Payer: Self-pay

## 2021-03-16 ENCOUNTER — Encounter (HOSPITAL_COMMUNITY): Payer: Self-pay

## 2021-03-16 DIAGNOSIS — Z113 Encounter for screening for infections with a predominantly sexual mode of transmission: Secondary | ICD-10-CM | POA: Insufficient documentation

## 2021-03-16 DIAGNOSIS — R1031 Right lower quadrant pain: Secondary | ICD-10-CM | POA: Insufficient documentation

## 2021-03-16 DIAGNOSIS — Z202 Contact with and (suspected) exposure to infections with a predominantly sexual mode of transmission: Secondary | ICD-10-CM | POA: Insufficient documentation

## 2021-03-16 DIAGNOSIS — Z87442 Personal history of urinary calculi: Secondary | ICD-10-CM | POA: Insufficient documentation

## 2021-03-16 LAB — POCT URINALYSIS DIPSTICK, ED / UC
Bilirubin Urine: NEGATIVE
Glucose, UA: NEGATIVE mg/dL
Ketones, ur: NEGATIVE mg/dL
Nitrite: NEGATIVE
Protein, ur: NEGATIVE mg/dL
Specific Gravity, Urine: 1.015 (ref 1.005–1.030)
Urobilinogen, UA: 0.2 mg/dL (ref 0.0–1.0)
pH: 7.5 (ref 5.0–8.0)

## 2021-03-16 MED ORDER — METRONIDAZOLE 500 MG PO TABS: 2000.0000 mg | ORAL_TABLET | Freq: Once | ORAL | 0 refills | Status: AC

## 2021-03-16 NOTE — ED Provider Notes (Signed)
[facility]    CSN: [id number] Arrival date & time: 03/16/21  1245      History   Chief Complaint Chief Complaint  Patient presents with  . Abdominal Pain    HPI Vincent Riley is a 45 y.o. male presenting with RLQ pain following exposure to trichomonas. History kidney stone years ago.  Notes crampy right lower quadrant pain for about 3 days.  Denies absolutely any other symptoms including back pain, pain radiating to groin, penile discharge, dysuria, penile lesions, penile rashes, testicular pain, testicular swelling, fever/chills, n/v/d.  HPI  Past Medical History:  Diagnosis Date  . Kidney calculi   . Kidney stone     There are no problems to display for this patient.   Past Surgical History:  Procedure Laterality Date  . KIDNEY STONE SURGERY    . LITHOTRIPSY         Home Medications    Prior to Admission medications   Medication Sig Start Date End Date Taking? Authorizing Provider  metroNIDAZOLE (FLAGYL) 500 MG tablet Take 4 tablets (2,000 mg total) by mouth once for 1 dose. 03/16/21 03/16/21 Yes Rhys Martini, PA-C    Family History History reviewed. No pertinent family history.  Social History Social History   Tobacco Use  . Smoking status: Former Games developer  . Smokeless tobacco: Never Used  Substance Use Topics  . Alcohol use: No  . Drug use: Yes    Types: Marijuana     Allergies   Patient has no known allergies.   Review of Systems Review of Systems  Constitutional: Negative for chills and fever.  HENT: Negative for sore throat.   Eyes: Negative for pain and redness.  Respiratory: Negative for shortness of breath.   Cardiovascular: Negative for chest pain.  Gastrointestinal: Positive for abdominal pain. Negative for diarrhea, nausea and vomiting.  Genitourinary: Negative for decreased urine volume, difficulty urinating, dysuria, flank pain, frequency, genital sores, hematuria, penile discharge, penile pain, penile swelling,  scrotal swelling, testicular pain and urgency.  Musculoskeletal: Negative for back pain.  Skin: Negative for rash.  All other systems reviewed and are negative.    Physical Exam Triage Vital Signs ED Triage Vitals  Enc Vitals Group     BP 03/16/21 1431 (!) 146/89     Pulse Rate 03/16/21 1431 61     Resp 03/16/21 1431 18     Temp 03/16/21 1431 97.9 F (36.6 C)     Temp Source 03/16/21 1431 Oral     SpO2 03/16/21 1431 99 %     Weight --      Height --      Head Circumference --      Peak Flow --      Pain Score 03/16/21 1429 3     Pain Loc --      Pain Edu? --      Excl. in GC? --    No data found.  Updated Vital Signs BP (!) 146/89 (BP Location: Right Arm)   Pulse 61   Temp 97.9 F (36.6 C) (Oral)   Resp 18   SpO2 99%   Visual Acuity Right Eye Distance:   Left Eye Distance:   Bilateral Distance:    Right Eye Near:   Left Eye Near:    Bilateral Near:     Physical Exam Vitals reviewed.  Constitutional:      General: He is not in acute distress.    Appearance: Normal appearance. He is not ill-appearing.  HENT:     Head: Normocephalic and atraumatic.     Mouth/Throat:     Mouth: Mucous membranes are moist.     Comments: Moist mucous membranes Eyes:     Extraocular Movements: Extraocular movements intact.     Pupils: Pupils are equal, round, and reactive to light.  Cardiovascular:     Rate and Rhythm: Normal rate and regular rhythm.     Heart sounds: Normal heart sounds.  Pulmonary:     Effort: Pulmonary effort is normal.     Breath sounds: Normal breath sounds. No wheezing, rhonchi or rales.  Abdominal:     General: Bowel sounds are normal. There is no distension.     Palpations: Abdomen is soft. There is no mass.     Tenderness: There is abdominal tenderness in the right lower quadrant. There is no right CVA tenderness, left CVA tenderness, guarding or rebound. Negative signs include Murphy's sign, Rovsing's sign and psoas sign.     Comments: RLQ  mildly TTP  Genitourinary:    Comments: deferred Skin:    General: Skin is warm.     Capillary Refill: Capillary refill takes less than 2 seconds.     Comments: Good skin turgor  Neurological:     General: No focal deficit present.     Mental Status: He is alert and oriented to person, place, and time.  Psychiatric:        Mood and Affect: Mood normal.        Behavior: Behavior normal.      UC Treatments / Results  Labs (all labs ordered are listed, but only abnormal results are displayed) Labs Reviewed  POCT URINALYSIS DIPSTICK, ED / UC - Abnormal; Notable for the following components:      Result Value   Hgb urine dipstick TRACE (*)    Leukocytes,Ua TRACE (*)    All other components within normal limits  CYTOLOGY, (ORAL, ANAL, URETHRAL) ANCILLARY ONLY    EKG   Radiology No results found.  Procedures Procedures (including critical care time)  Medications Ordered in UC Medications - No data to display  Initial Impression / Assessment and Plan / UC Course  I have reviewed the triage vital signs and the nursing notes.  Pertinent labs & imaging results that were available during my care of the patient were reviewed by me and considered in my medical decision making (see chart for details).     This patient is a 45 year old male presenting with right lower quadrant pain following exposure to trichomonas.  He is afebrile, nontachycardic nontachypneic, without CVAT.  He does have some right lower quadrant pain to palpation.  Plan to treat empirically with Flagyl as below.  Self swab sent for chlamydia, trichomonas, gonorrhea.  Patient with history of kidney stones in the past, but low suspicion for this today based on presentation and only trace blood in UA.  ED return precautions discussed.  Final Clinical Impressions(s) / UC Diagnoses   Final diagnoses:  Exposure to trichomonas  RLQ abdominal pain  Routine screening for STI (sexually transmitted infection)  History  of kidney stones     Discharge Instructions     -We are treating you for trichomonas today with Flagyl (metronidazole), take all 4 pills at the same time.  You can take this with food if you have a sensitive stomach. -If your abdominal pain gets worse instead of better, or if you develop new symptoms despite treatment like penile discharge, back pain, fever/chills-seek additional medical  treatment. -We are also testing for gonorrhea and chlamydia; we will call you if either of these is positive. -Abstain from intercourse for 7 days to ensure you don't pass the trichomonas back to your partner.    ED Prescriptions    Medication Sig Dispense Auth. Provider   metroNIDAZOLE (FLAGYL) 500 MG tablet Take 4 tablets (2,000 mg total) by mouth once for 1 dose. 4 tablet Rhys Martini, PA-C     PDMP not reviewed this encounter.   Rhys Martini, PA-C 03/16/21 1452

## 2021-03-16 NOTE — Discharge Instructions (Addendum)
-  We are treating you for trichomonas today with Flagyl (metronidazole), take all 4 pills at the same time.  You can take this with food if you have a sensitive stomach. -If your abdominal pain gets worse instead of better, or if you develop new symptoms despite treatment like penile discharge, back pain, fever/chills-seek additional medical treatment. -We are also testing for gonorrhea and chlamydia; we will call you if either of these is positive. -Abstain from intercourse for 7 days to ensure you don't pass the trichomonas back to your partner.

## 2021-03-16 NOTE — ED Triage Notes (Signed)
Pt c/o RLQ pain X 3 days. Pt denies other sxs.

## 2021-03-17 LAB — CYTOLOGY, (ORAL, ANAL, URETHRAL) ANCILLARY ONLY
Chlamydia: NEGATIVE
Comment: NEGATIVE
Comment: NEGATIVE
Comment: NORMAL
Neisseria Gonorrhea: NEGATIVE
Trichomonas: POSITIVE — AB

## 2021-05-22 ENCOUNTER — Emergency Department (HOSPITAL_BASED_OUTPATIENT_CLINIC_OR_DEPARTMENT_OTHER)
Admission: EM | Admit: 2021-05-22 | Discharge: 2021-05-22 | Disposition: A | Discharge status: Home / Self Care | Payer: Medicaid Other | Attending: Emergency Medicine | Admitting: Emergency Medicine

## 2021-05-22 ENCOUNTER — Other Ambulatory Visit: Payer: Self-pay

## 2021-05-22 ENCOUNTER — Encounter (HOSPITAL_BASED_OUTPATIENT_CLINIC_OR_DEPARTMENT_OTHER): Payer: Self-pay | Admitting: Emergency Medicine

## 2021-05-22 DIAGNOSIS — B9689 Other specified bacterial agents as the cause of diseases classified elsewhere: Secondary | ICD-10-CM | POA: Insufficient documentation

## 2021-05-22 DIAGNOSIS — N342 Other urethritis: Secondary | ICD-10-CM

## 2021-05-22 DIAGNOSIS — Z87891 Personal history of nicotine dependence: Secondary | ICD-10-CM | POA: Insufficient documentation

## 2021-05-22 LAB — URINALYSIS, ROUTINE W REFLEX MICROSCOPIC
Bilirubin Urine: NEGATIVE
Glucose, UA: NEGATIVE mg/dL
Ketones, ur: NEGATIVE mg/dL
Leukocytes,Ua: NEGATIVE
Nitrite: NEGATIVE
Protein, ur: NEGATIVE mg/dL
Specific Gravity, Urine: 1.02 (ref 1.005–1.030)
pH: 7 (ref 5.0–8.0)

## 2021-05-22 LAB — HIV ANTIBODY (ROUTINE TESTING W REFLEX): HIV Screen 4th Generation wRfx: NONREACTIVE

## 2021-05-22 LAB — URINALYSIS, MICROSCOPIC (REFLEX)

## 2021-05-22 MED ORDER — LIDOCAINE HCL (PF) 1 % IJ SOLN
INTRAMUSCULAR | Status: AC
  Administered 2021-05-22: 1.2 mL
  Filled 2021-05-22: qty 5

## 2021-05-22 MED ORDER — DOXYCYCLINE HYCLATE 100 MG PO CAPS: 100.0000 mg | ORAL_CAPSULE | Freq: Two times a day (BID) | ORAL | 0 refills | Status: DC

## 2021-05-22 MED ORDER — METRONIDAZOLE 500 MG PO TABS: 500.0000 mg | ORAL_TABLET | Freq: Two times a day (BID) | ORAL | 0 refills | Status: DC

## 2021-05-22 MED ORDER — CEFTRIAXONE SODIUM 500 MG IJ SOLR
500.0000 mg | Freq: Once | INTRAMUSCULAR | Status: AC
  Administered 2021-05-22: 500 mg via INTRAMUSCULAR
  Filled 2021-05-22: qty 500

## 2021-05-22 NOTE — ED Provider Notes (Signed)
[facility] EMERGENCY DEPARTMENT Provider Note   CSN: [id number] Arrival date & time: 05/22/21  1001     History Chief Complaint  Patient presents with   Abdominal Pain    Vincent Riley is a 45 y.o. male.  Patient is a 45 year old male who presents with penile discharge.  He states his girlfriend just tested positive for chlamydia and trichomonas.  His symptoms started yesterday.  He denies any abdominal pain.  No nausea or vomiting.  No fevers.  No difficulty with urination.  He does have a prior history of chlamydia.      Past Medical History:  Diagnosis Date   Kidney calculi    Kidney stone     There are no problems to display for this patient.   Past Surgical History:  Procedure Laterality Date   KIDNEY STONE SURGERY     LITHOTRIPSY         History reviewed. No pertinent family history.  Social History   Tobacco Use   Smoking status: Former    Pack years: 0.00   Smokeless tobacco: Never  Substance Use Topics   Alcohol use: Not Currently   Drug use: Yes    Types: Marijuana    Home Medications Prior to Admission medications   Medication Sig Start Date End Date Taking? Authorizing Provider  doxycycline (VIBRAMYCIN) 100 MG capsule Take 1 capsule (100 mg total) by mouth 2 (two) times daily. One po bid x 7 days 05/22/21  Yes Rolan Bucco, MD  metroNIDAZOLE (FLAGYL) 500 MG tablet Take 1 tablet (500 mg total) by mouth 2 (two) times daily. One po bid x 7 days 05/22/21  Yes Rolan Bucco, MD    Allergies    Patient has no known allergies.  Review of Systems   Review of Systems  Constitutional:  Negative for fever.  HENT: Negative.    Eyes: Negative.   Respiratory:  Negative for cough and shortness of breath.   Cardiovascular:  Negative for chest pain.  Gastrointestinal:  Negative for abdominal pain, nausea and vomiting.  Genitourinary:  Positive for penile discharge. Negative for scrotal swelling and testicular pain.  Musculoskeletal:  Negative.   Skin:  Negative for rash.  Neurological:  Negative for headaches.   Physical Exam Updated Vital Signs BP (!) 153/83 (BP Location: Right Arm)   Pulse 66   Temp 97.6 F (36.4 C) (Oral)   Resp 16   Ht 5\' 9"  (1.753 m)   Wt 77.1 kg   SpO2 100%   BMI 25.10 kg/m   Physical Exam HENT:     Head: Normocephalic and atraumatic.  Cardiovascular:     Rate and Rhythm: Normal rate.  Pulmonary:     Effort: Pulmonary effort is normal.  Abdominal:     Palpations: Abdomen is soft.     Tenderness: There is no abdominal tenderness.  Genitourinary:    Comments: Normal appearing male circumcised genitalia.  No discharge is noted.  No skin lesions.  No testicular tenderness. Skin:    General: Skin is warm and dry.  Neurological:     General: No focal deficit present.     Mental Status: He is alert.    ED Results / Procedures / Treatments   Labs (all labs ordered are listed, but only abnormal results are displayed) Labs Reviewed  URINALYSIS, ROUTINE W REFLEX MICROSCOPIC - Abnormal; Notable for the following components:      Result Value   Hgb urine dipstick SMALL (*)    All other  components within normal limits  URINALYSIS, MICROSCOPIC (REFLEX) - Abnormal; Notable for the following components:   Bacteria, UA FEW (*)    Trichomonas, UA PRESENT (*)    All other components within normal limits  RPR  HIV ANTIBODY (ROUTINE TESTING W REFLEX)  GC/CHLAMYDIA PROBE AMP ([place]) NOT AT [facility]    EKG None  Radiology No results found.  Procedures Procedures   Medications Ordered in ED Medications  cefTRIAXone (ROCEPHIN) injection 500 mg (500 mg Intramuscular Given 05/22/21 1050)  lidocaine (PF) (XYLOCAINE) 1 % injection (1.2 mLs  Given 05/22/21 1050)    ED Course  I have reviewed the triage vital signs and the nursing notes.  Pertinent labs & imaging results that were available during my care of the patient were reviewed by me and considered in my medical decision making  (see chart for details).    MDM Rules/Calculators/A&P                          Patient is a 45 year old male who presents with penile discharge.  He had a recent exposure to chlamydia and trichomonas.  He has no associate abdominal pain.  He is otherwise well-appearing.  He was discharged home in good condition.  He was given a prescription for Flagyl and doxycycline.  He was given a shot of Rocephin in the ED.  His STD testing is pending.  Return precautions were given. Final Clinical Impression(s) / ED Diagnoses Final diagnoses:  Urethritis    Rx / DC Orders ED Discharge Orders          Ordered    doxycycline (VIBRAMYCIN) 100 MG capsule  2 times daily        05/22/21 1028    metroNIDAZOLE (FLAGYL) 500 MG tablet  2 times daily        05/22/21 1130             Rolan Bucco, MD 05/22/21 1132

## 2021-05-22 NOTE — ED Notes (Signed)
ED Provider at bedside. 

## 2021-05-22 NOTE — ED Triage Notes (Signed)
Pt arrives pov, ambulatory with c/o RLQ pain x 1 week. Pt denies N/V/D, denies dysuria

## 2021-05-22 NOTE — ED Notes (Signed)
Denies RLQ pain. Did not want to tell triage RN why he was here.
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
## 2021-05-23 LAB — RPR: RPR Ser Ql: NONREACTIVE

## 2021-05-24 LAB — GC/CHLAMYDIA PROBE AMP (~~LOC~~) NOT AT ARMC
Chlamydia: NEGATIVE
Comment: NEGATIVE
Comment: NORMAL
Neisseria Gonorrhea: NEGATIVE

## 2021-12-18 ENCOUNTER — Other Ambulatory Visit: Payer: Self-pay

## 2021-12-18 ENCOUNTER — Encounter (HOSPITAL_BASED_OUTPATIENT_CLINIC_OR_DEPARTMENT_OTHER): Payer: Self-pay | Admitting: Emergency Medicine

## 2021-12-18 ENCOUNTER — Emergency Department (HOSPITAL_BASED_OUTPATIENT_CLINIC_OR_DEPARTMENT_OTHER)
Admission: EM | Admit: 2021-12-18 | Discharge: 2021-12-18 | Disposition: A | Discharge status: Home / Self Care | Payer: Medicaid Other | Attending: Emergency Medicine | Admitting: Emergency Medicine

## 2021-12-18 DIAGNOSIS — Z202 Contact with and (suspected) exposure to infections with a predominantly sexual mode of transmission: Secondary | ICD-10-CM | POA: Insufficient documentation

## 2021-12-18 DIAGNOSIS — R011 Cardiac murmur, unspecified: Secondary | ICD-10-CM | POA: Insufficient documentation

## 2021-12-18 LAB — HIV ANTIBODY (ROUTINE TESTING W REFLEX): HIV Screen 4th Generation wRfx: NONREACTIVE

## 2021-12-18 MED ORDER — METRONIDAZOLE 500 MG PO TABS: 500.0000 mg | ORAL_TABLET | Freq: Two times a day (BID) | ORAL | 0 refills | Status: DC
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
## 2021-12-18 NOTE — Discharge Instructions (Addendum)
Follow-up with a primary care doctor regarding your murmur.  Return to emergency room if you have any worsening symptoms.
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
## 2021-12-19 LAB — RPR: RPR Ser Ql: NONREACTIVE

## 2021-12-20 LAB — GC/CHLAMYDIA PROBE AMP (~~LOC~~) NOT AT ARMC
Chlamydia: NEGATIVE
Comment: NEGATIVE
Comment: NORMAL
Neisseria Gonorrhea: NEGATIVE

## 2023-01-10 ENCOUNTER — Other Ambulatory Visit: Payer: Self-pay

## 2023-01-10 ENCOUNTER — Emergency Department (HOSPITAL_BASED_OUTPATIENT_CLINIC_OR_DEPARTMENT_OTHER): Payer: Medicaid Other

## 2023-01-10 ENCOUNTER — Encounter (HOSPITAL_BASED_OUTPATIENT_CLINIC_OR_DEPARTMENT_OTHER): Payer: Self-pay

## 2023-01-10 ENCOUNTER — Emergency Department (HOSPITAL_BASED_OUTPATIENT_CLINIC_OR_DEPARTMENT_OTHER)
Admission: EM | Admit: 2023-01-10 | Discharge: 2023-01-10 | Disposition: A | Discharge status: Home / Self Care | Payer: Medicaid Other | Attending: Emergency Medicine | Admitting: Emergency Medicine

## 2023-01-10 DIAGNOSIS — Z7722 Contact with and (suspected) exposure to environmental tobacco smoke (acute) (chronic): Secondary | ICD-10-CM | POA: Diagnosis not present

## 2023-01-10 DIAGNOSIS — R509 Fever, unspecified: Secondary | ICD-10-CM | POA: Diagnosis present

## 2023-01-10 DIAGNOSIS — Z28310 Unvaccinated for covid-19: Secondary | ICD-10-CM | POA: Diagnosis not present

## 2023-01-10 DIAGNOSIS — J101 Influenza due to other identified influenza virus with other respiratory manifestations: Secondary | ICD-10-CM | POA: Insufficient documentation

## 2023-01-10 DIAGNOSIS — Z20822 Contact with and (suspected) exposure to covid-19: Secondary | ICD-10-CM | POA: Diagnosis not present

## 2023-01-10 DIAGNOSIS — J111 Influenza due to unidentified influenza virus with other respiratory manifestations: Secondary | ICD-10-CM

## 2023-01-10 LAB — RESP PANEL BY RT-PCR (RSV, FLU A&B, COVID)  RVPGX2
Influenza A by PCR: NEGATIVE
Influenza B by PCR: POSITIVE — AB
Resp Syncytial Virus by PCR: NEGATIVE
SARS Coronavirus 2 by RT PCR: NEGATIVE

## 2023-01-10 MED ORDER — SODIUM CHLORIDE 0.9 % IV BOLUS
500.0000 mL | Freq: Once | INTRAVENOUS | Status: AC
  Administered 2023-01-10: 500 mL via INTRAVENOUS

## 2023-01-10 NOTE — ED Triage Notes (Addendum)
Pt reports being sick for a week. Pt reports weakness coughing. SOB, HA , loose stools and hand cramps.

## 2023-01-10 NOTE — ED Provider Notes (Signed)
[facility] Provider Note   CSN: [id number] Arrival date & time: 01/10/23  1818     History {Add pertinent medical, surgical, social history, OB history to HPI:1} Chief Complaint  Patient presents with   Weakness    Vincent Riley is a 47 y.o. male.  He has no significant past medical history.  He said he has been sick for over a week with intermittent fevers chills cough nonproductive along with decreased appetite diarrhea fatigue.  He said yesterday he was in an area with cigarette smoking and it smells funny so he was wondering if he had COVID.  He has not had a COVID vaccination or flu shot.  The history is provided by the patient.  Influenza Presenting symptoms: cough, diarrhea, fatigue, fever, myalgias and nausea   Cough:    Cough characteristics:  Non-productive   Sputum characteristics:  Nondescript   Severity:  Moderate   Onset quality:  Gradual   Duration:  1 week   Timing:  Intermittent   Progression:  Unchanged   Chronicity:  New Associated symptoms: decreased appetite and decreased physical activity   Associated symptoms: no syncope   Risk factors: no diabetes problem, no kidney disease and no liver disease        Home Medications Prior to Admission medications   Medication Sig Start Date End Date Taking? Authorizing Provider  doxycycline (VIBRAMYCIN) 100 MG capsule Take 1 capsule (100 mg total) by mouth 2 (two) times daily. One po bid x 7 days 05/22/21   Malvin Johns, MD  metroNIDAZOLE (FLAGYL) 500 MG tablet Take 1 tablet (500 mg total) by mouth 2 (two) times daily. One po bid x 7 days 12/18/21   Malvin Johns, MD      Allergies    Patient has no known allergies.    Review of Systems   Review of Systems  Constitutional:  Positive for decreased appetite, fatigue and fever.  Eyes:  Negative for visual disturbance.  Respiratory:  Positive for cough.   Cardiovascular:  Negative for chest pain.   Gastrointestinal:  Positive for diarrhea and nausea. Negative for abdominal pain.  Genitourinary:  Negative for dysuria.  Musculoskeletal:  Positive for myalgias.  Skin:  Negative for rash.    Physical Exam Updated Vital Signs BP 124/78 (BP Location: Left Arm)   Pulse 60   Temp 97.8 F (36.6 C)   Resp 18   Ht '5\' 9"'$  (1.753 m)   Wt 79.4 kg   SpO2 100%   BMI 25.84 kg/m  Physical Exam Vitals and nursing note reviewed.  Constitutional:      General: He is not in acute distress.    Appearance: Normal appearance. He is well-developed.  HENT:     Head: Normocephalic and atraumatic.  Eyes:     Conjunctiva/sclera: Conjunctivae normal.  Cardiovascular:     Rate and Rhythm: Normal rate and regular rhythm.     Heart sounds: No murmur heard. Pulmonary:     Effort: Pulmonary effort is normal. No respiratory distress.     Breath sounds: Normal breath sounds.  Abdominal:     Palpations: Abdomen is soft.     Tenderness: There is no abdominal tenderness. There is no guarding or rebound.  Musculoskeletal:        General: No deformity.     Cervical back: Neck supple.  Skin:    General: Skin is warm and dry.     Capillary Refill: Capillary refill takes less than  2 seconds.  Neurological:     General: No focal deficit present.     Mental Status: He is alert.     ED Results / Procedures / Treatments   Labs (all labs ordered are listed, but only abnormal results are displayed) Labs Reviewed  RESP PANEL BY RT-PCR (RSV, FLU A&B, COVID)  [id number]  CBC WITH DIFFERENTIAL/PLATELET  COMPREHENSIVE METABOLIC PANEL    EKG None  Radiology No results found.  Procedures Procedures  {Document cardiac monitor, telemetry assessment procedure when appropriate:1}  Medications Ordered in ED Medications - No data to display  ED Course/ Medical Decision Making/ A&P   {   Click here for ABCD2, HEART and other calculatorsREFRESH Note before signing :1}                          Medical  Decision Making Amount and/or Complexity of Data Reviewed Labs: ordered. Radiology: ordered.   This patient complains of ***; this involves an extensive number of treatment Options and is a complaint that carries with it a high risk of complications and morbidity. The differential includes ***  I ordered, reviewed and interpreted labs, which included *** I ordered medication *** and reviewed PMP when indicated. I ordered imaging studies which included *** and I independently    visualized and interpreted imaging which showed *** Additional history obtained from *** Previous records obtained and reviewed *** I consulted *** and discussed lab and imaging findings and discussed disposition.  Cardiac monitoring reviewed, *** Social determinants considered, *** Critical Interventions: ***  After the interventions stated above, I reevaluated the patient and found *** Admission and further testing considered, ***   {Document critical care time when appropriate:1} {Document review of labs and clinical decision tools ie heart score, Chads2Vasc2 etc:1}  {Document your independent review of radiology images, and any outside records:1} {Document your discussion with family members, caretakers, and with consultants:1} {Document social determinants of health affecting pt's care:1} {Document your decision making why or why not admission, treatments were needed:1} Final Clinical Impression(s) / ED Diagnoses Final diagnoses:  None    Rx / DC Orders ED Discharge Orders     None

## 2023-01-10 NOTE — Discharge Instructions (Signed)
You were seen in the emergency department after feeling sick for a week.  Your COVID test was negative but you did test positive for influenza.  Please rest and drink plenty of fluids.  Tylenol as needed for fever and pain.  Return to the emergency department if any worsening or concerning symptoms

## 2023-01-14 ENCOUNTER — Other Ambulatory Visit: Payer: Self-pay | Admitting: Nurse Practitioner

## 2023-01-14 DIAGNOSIS — R011 Cardiac murmur, unspecified: Secondary | ICD-10-CM

## 2023-01-14 DIAGNOSIS — R5383 Other fatigue: Secondary | ICD-10-CM

## 2023-03-15 ENCOUNTER — Encounter (HOSPITAL_BASED_OUTPATIENT_CLINIC_OR_DEPARTMENT_OTHER): Payer: Self-pay | Admitting: Cardiovascular Disease

## 2023-03-15 ENCOUNTER — Ambulatory Visit (INDEPENDENT_AMBULATORY_CARE_PROVIDER_SITE_OTHER): Payer: Medicaid Other | Admitting: Cardiovascular Disease

## 2023-03-15 VITALS — BP 106/68 | HR 67 | Ht 69.0 in | Wt 180.9 lb

## 2023-03-15 DIAGNOSIS — Z13220 Encounter for screening for lipoid disorders: Secondary | ICD-10-CM

## 2023-03-15 DIAGNOSIS — R002 Palpitations: Secondary | ICD-10-CM | POA: Diagnosis not present

## 2023-03-15 DIAGNOSIS — R011 Cardiac murmur, unspecified: Secondary | ICD-10-CM | POA: Diagnosis not present

## 2023-03-15 HISTORY — DX: Cardiac murmur, unspecified: R01.1

## 2023-03-15 HISTORY — DX: Palpitations: R00.2

## 2023-03-15 NOTE — Assessment & Plan Note (Signed)
I don't appreciate a murmur on exam today.  Previously noted.  He has no heart failure symptoms.  We will get a baseline echo.
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
## 2023-03-15 NOTE — Patient Instructions (Signed)
Medication Instructions:  Your physician recommends that you continue on your current medications as directed. Please refer to the Current Medication list given to you today.   Labwork: FASTING LP/CMET/A1C/TSH/CBC SOON   Testing/Procedures: Your physician has requested that you have an echocardiogram. Echocardiography is a painless test that uses sound waves to create images of your heart. It provides your doctor with information about the size and shape of your heart and how well your heart's chambers and valves are working. This procedure takes approximately one hour. There are no restrictions for this procedure. Please do NOT wear cologne, perfume, aftershave, or lotions (deodorant is allowed). Please arrive 15 minutes prior to your appointment time.  Follow-Up: AS NEEDED

## 2023-03-15 NOTE — Assessment & Plan Note (Signed)
Episodes are benign.  He has no exertional symptoms.  Continue to monitor.  Check CBC, CMP, TSH.

## 2023-04-18 ENCOUNTER — Other Ambulatory Visit (HOSPITAL_BASED_OUTPATIENT_CLINIC_OR_DEPARTMENT_OTHER): Payer: Medicaid Other

## 2023-04-22 ENCOUNTER — Other Ambulatory Visit: Payer: Self-pay | Admitting: Nurse Practitioner

## 2023-04-22 DIAGNOSIS — Z1211 Encounter for screening for malignant neoplasm of colon: Secondary | ICD-10-CM

## 2023-04-22 DIAGNOSIS — R7303 Prediabetes: Secondary | ICD-10-CM

## 2023-04-22 DIAGNOSIS — Z0100 Encounter for examination of eyes and vision without abnormal findings: Secondary | ICD-10-CM

## 2023-04-22 NOTE — Addendum Note (Signed)
Addended by: Mady Gemma on: 04/22/2023 08:27 PM   Modules accepted: Orders

## 2023-04-22 NOTE — Progress Notes (Signed)
This encounter created in error.

## 2023-05-12 ENCOUNTER — Ambulatory Visit (INDEPENDENT_AMBULATORY_CARE_PROVIDER_SITE_OTHER): Payer: Medicaid Other

## 2023-05-12 DIAGNOSIS — R011 Cardiac murmur, unspecified: Secondary | ICD-10-CM

## 2023-05-12 LAB — ECHOCARDIOGRAM COMPLETE
AR max vel: 2.05 cm2
AV Area VTI: 2.11 cm2
AV Area mean vel: 1.95 cm2
AV Mean grad: 4 mmHg
AV Peak grad: 8.3 mmHg
Ao pk vel: 1.44 m/s
Area-P 1/2: 3.85 cm2
S' Lateral: 3.17 cm

## 2024-04-06 ENCOUNTER — Emergency Department (HOSPITAL_BASED_OUTPATIENT_CLINIC_OR_DEPARTMENT_OTHER)
Admission: EM | Admit: 2024-04-06 | Discharge: 2024-04-06 | Disposition: A | Discharge status: Home / Self Care | Attending: Emergency Medicine | Admitting: Emergency Medicine

## 2024-04-06 ENCOUNTER — Other Ambulatory Visit: Payer: Self-pay

## 2024-04-06 DIAGNOSIS — A599 Trichomoniasis, unspecified: Secondary | ICD-10-CM | POA: Diagnosis not present

## 2024-04-06 DIAGNOSIS — R1031 Right lower quadrant pain: Secondary | ICD-10-CM | POA: Diagnosis present

## 2024-04-06 DIAGNOSIS — Z202 Contact with and (suspected) exposure to infections with a predominantly sexual mode of transmission: Secondary | ICD-10-CM

## 2024-04-06 MED ORDER — METRONIDAZOLE 500 MG PO TABS
2000.0000 mg | ORAL_TABLET | Freq: Once | ORAL | Status: AC
  Administered 2024-04-06: 2000 mg via ORAL
  Filled 2024-04-06: qty 4

## 2024-04-06 NOTE — ED Triage Notes (Signed)
 Pt reports constant RLQ pain that began a week ago.   Denies NVD, fever, and constipation

## 2024-04-06 NOTE — ED Provider Notes (Signed)
 [facility] Provider Note   CSN: [id number] Arrival date & time: 04/06/24  1478     History  Chief Complaint  Patient presents with   Abdominal Pain    Vincent Riley is a 48 y.o. male.  Pt is a 48 yo male with pmhx significant for kidney stones.  Pt told the nurse he was here for abd pain.  However, he told me that the only reason he is here is that his girlfriend was just diagnosed with trichomonas and he wants medications for that too.  He does not want to be checked for anything else and does not have abd pain.       Home Medications Prior to Admission medications   Not on File      Allergies    Patient has no known allergies.    Review of Systems   Review of Systems  All other systems reviewed and are negative.   Physical Exam Updated Vital Signs BP (!) 157/84 (BP Location: Right Arm)   Pulse 60   Temp 97.8 F (36.6 C) (Oral)   Resp 18   SpO2 99%  Physical Exam Vitals and nursing note reviewed.  Constitutional:      Appearance: He is well-developed.  HENT:     Head: Normocephalic and atraumatic.     Mouth/Throat:     Mouth: Mucous membranes are moist.     Pharynx: Oropharynx is clear.  Eyes:     Extraocular Movements: Extraocular movements intact.     Pupils: Pupils are equal, round, and reactive to light.  Cardiovascular:     Rate and Rhythm: Normal rate and regular rhythm.     Heart sounds: Normal heart sounds.  Pulmonary:     Effort: Pulmonary effort is normal.     Breath sounds: Normal breath sounds.  Abdominal:     General: Abdomen is flat. Bowel sounds are normal.     Palpations: Abdomen is soft.  Skin:    General: Skin is warm.     Capillary Refill: Capillary refill takes less than 2 seconds.  Neurological:     General: No focal deficit present.     Mental Status: He is alert and oriented to person, place, and time.  Psychiatric:        Mood and Affect: Mood normal.        Behavior:  Behavior normal.     ED Results / Procedures / Treatments   Labs (all labs ordered are listed, but only abnormal results are displayed) Labs Reviewed - No data to display  EKG None  Radiology No results found.  Procedures Procedures    Medications Ordered in ED Medications  metroNIDAZOLE  (FLAGYL ) tablet 2,000 mg (has no administration in time range)    ED Course/ Medical Decision Making/ A&P                                 Medical Decision Making Risk Prescription drug management.   Pt does not want to be checked for any other STIs.  He does not actually  have abd pain.  I am going to tx him with flagyl  for trich.  He is stable for d/c.  Return if worse.         Final Clinical Impression(s) / ED Diagnoses Final diagnoses:  Trichomonas exposure    Rx / DC Orders ED Discharge Orders  None         Sueellen Emery, MD 04/06/24 [id number]
